# Patient Record
Sex: Female | Born: 1955
Health system: Southern US, Community
[De-identification: ages and names within clinical notes are randomized; demographics above are authoritative.]

## PROBLEM LIST (undated history)

## (undated) DIAGNOSIS — Z136 Encounter for screening for cardiovascular disorders: Secondary | ICD-10-CM

## (undated) DIAGNOSIS — Z8041 Family history of malignant neoplasm of ovary: Secondary | ICD-10-CM

## (undated) DIAGNOSIS — Z8049 Family history of malignant neoplasm of other genital organs: Secondary | ICD-10-CM

## (undated) DIAGNOSIS — N6009 Solitary cyst of unspecified breast: Secondary | ICD-10-CM

## (undated) DIAGNOSIS — Z516 Encounter for desensitization to allergens: Secondary | ICD-10-CM

## (undated) DIAGNOSIS — C439 Malignant melanoma of skin, unspecified: Secondary | ICD-10-CM

## (undated) DIAGNOSIS — K625 Hemorrhage of anus and rectum: Secondary | ICD-10-CM

## (undated) DIAGNOSIS — J45990 Exercise induced bronchospasm: Secondary | ICD-10-CM

## (undated) HISTORY — DX: Encounter for desensitization to allergens: Z51.6

## (undated) HISTORY — DX: Family history of malignant neoplasm of ovary: Z80.41

## (undated) HISTORY — PX: OTHER SURGICAL HISTORY: SHX169

## (undated) HISTORY — DX: Family history of malignant neoplasm of other genital organs: Z80.49

## (undated) HISTORY — DX: Hemorrhage of anus and rectum: K62.5

## (undated) HISTORY — DX: Malignant melanoma of skin, unspecified: C43.9

## (undated) HISTORY — DX: Solitary cyst of unspecified breast: N60.09

## (undated) HISTORY — DX: Exercise induced bronchospasm: J45.990

## (undated) HISTORY — DX: Encounter for screening for cardiovascular disorders: Z13.6

---

## 1987-02-09 HISTORY — PX: OTHER SURGICAL HISTORY: SHX169

## 1997-05-08 ENCOUNTER — Ambulatory Visit (HOSPITAL_COMMUNITY): Admission: RE | Admit: 1997-05-08 | Discharge: 1997-05-08 | Payer: Self-pay | Admitting: Obstetrics & Gynecology

## 1997-05-09 ENCOUNTER — Ambulatory Visit (HOSPITAL_COMMUNITY): Admission: RE | Admit: 1997-05-09 | Discharge: 1997-05-09 | Payer: Self-pay | Admitting: Obstetrics and Gynecology

## 1998-04-28 ENCOUNTER — Other Ambulatory Visit: Admission: RE | Admit: 1998-04-28 | Discharge: 1998-04-28 | Payer: Self-pay | Admitting: Obstetrics & Gynecology

## 1998-05-21 ENCOUNTER — Ambulatory Visit (HOSPITAL_COMMUNITY): Admission: RE | Admit: 1998-05-21 | Discharge: 1998-05-21 | Payer: Self-pay | Admitting: Obstetrics and Gynecology

## 1998-06-02 ENCOUNTER — Other Ambulatory Visit: Admission: RE | Admit: 1998-06-02 | Discharge: 1998-06-02 | Payer: Self-pay | Admitting: Obstetrics & Gynecology

## 1999-04-28 ENCOUNTER — Other Ambulatory Visit: Admission: RE | Admit: 1999-04-28 | Discharge: 1999-04-28 | Payer: Self-pay | Admitting: Obstetrics and Gynecology

## 1999-05-14 ENCOUNTER — Encounter: Admission: RE | Admit: 1999-05-14 | Discharge: 1999-05-14 | Payer: Self-pay | Admitting: Obstetrics and Gynecology

## 1999-05-14 ENCOUNTER — Encounter: Payer: Self-pay | Admitting: Obstetrics and Gynecology

## 2000-05-04 ENCOUNTER — Other Ambulatory Visit: Admission: RE | Admit: 2000-05-04 | Discharge: 2000-05-04 | Payer: Self-pay | Admitting: Obstetrics and Gynecology

## 2000-05-23 ENCOUNTER — Encounter: Payer: Self-pay | Admitting: Obstetrics and Gynecology

## 2000-05-23 ENCOUNTER — Encounter: Admission: RE | Admit: 2000-05-23 | Discharge: 2000-05-23 | Payer: Self-pay | Admitting: Obstetrics and Gynecology

## 2001-10-04 ENCOUNTER — Other Ambulatory Visit: Admission: RE | Admit: 2001-10-04 | Discharge: 2001-10-04 | Payer: Self-pay | Admitting: Obstetrics and Gynecology

## 2001-10-06 ENCOUNTER — Encounter: Payer: Self-pay | Admitting: Obstetrics and Gynecology

## 2001-10-06 ENCOUNTER — Encounter: Admission: RE | Admit: 2001-10-06 | Discharge: 2001-10-06 | Payer: Self-pay | Admitting: Obstetrics and Gynecology

## 2002-10-18 ENCOUNTER — Other Ambulatory Visit: Admission: RE | Admit: 2002-10-18 | Discharge: 2002-10-18 | Payer: Self-pay | Admitting: Obstetrics and Gynecology

## 2002-12-21 ENCOUNTER — Encounter: Admission: RE | Admit: 2002-12-21 | Discharge: 2002-12-21 | Payer: Self-pay | Admitting: Obstetrics and Gynecology

## 2003-12-04 ENCOUNTER — Other Ambulatory Visit: Admission: RE | Admit: 2003-12-04 | Discharge: 2003-12-04 | Payer: Self-pay | Admitting: Obstetrics and Gynecology

## 2004-02-21 ENCOUNTER — Encounter: Admission: RE | Admit: 2004-02-21 | Discharge: 2004-02-21 | Payer: Self-pay | Admitting: Obstetrics and Gynecology

## 2004-03-04 ENCOUNTER — Encounter: Admission: RE | Admit: 2004-03-04 | Discharge: 2004-03-04 | Payer: Self-pay | Admitting: Obstetrics and Gynecology

## 2005-03-17 ENCOUNTER — Encounter: Admission: RE | Admit: 2005-03-17 | Discharge: 2005-03-17 | Payer: Self-pay | Admitting: Internal Medicine

## 2005-04-09 ENCOUNTER — Encounter: Admission: RE | Admit: 2005-04-09 | Discharge: 2005-04-09 | Payer: Self-pay | Admitting: Internal Medicine

## 2005-05-03 ENCOUNTER — Ambulatory Visit: Payer: Self-pay | Admitting: Internal Medicine

## 2005-06-22 ENCOUNTER — Other Ambulatory Visit: Admission: RE | Admit: 2005-06-22 | Discharge: 2005-06-22 | Payer: Self-pay | Admitting: Internal Medicine

## 2005-06-22 ENCOUNTER — Encounter: Payer: Self-pay | Admitting: Internal Medicine

## 2005-06-22 ENCOUNTER — Ambulatory Visit: Payer: Self-pay | Admitting: Internal Medicine

## 2005-10-14 ENCOUNTER — Ambulatory Visit: Payer: Self-pay | Admitting: Gastroenterology

## 2005-10-28 ENCOUNTER — Ambulatory Visit: Payer: Self-pay | Admitting: Gastroenterology

## 2005-10-28 LAB — HM COLONOSCOPY

## 2006-02-24 ENCOUNTER — Ambulatory Visit: Payer: Self-pay | Admitting: Internal Medicine

## 2006-03-11 ENCOUNTER — Encounter: Payer: Self-pay | Admitting: Internal Medicine

## 2006-03-11 ENCOUNTER — Ambulatory Visit: Payer: Self-pay

## 2006-05-25 ENCOUNTER — Encounter: Admission: RE | Admit: 2006-05-25 | Discharge: 2006-05-25 | Payer: Self-pay | Admitting: Internal Medicine

## 2006-09-30 ENCOUNTER — Encounter: Admission: RE | Admit: 2006-09-30 | Discharge: 2006-09-30 | Payer: Self-pay | Admitting: Internal Medicine

## 2006-10-21 ENCOUNTER — Encounter: Payer: Self-pay | Admitting: Internal Medicine

## 2006-10-25 ENCOUNTER — Encounter: Admission: RE | Admit: 2006-10-25 | Discharge: 2006-10-25 | Payer: Self-pay | Admitting: Otolaryngology

## 2007-01-27 ENCOUNTER — Ambulatory Visit: Payer: Self-pay | Admitting: Internal Medicine

## 2007-01-27 LAB — CONVERTED CEMR LAB
Bilirubin Urine: NEGATIVE
Blood in Urine, dipstick: NEGATIVE
Glucose, Urine, Semiquant: NEGATIVE
WBC Urine, dipstick: NEGATIVE
pH: 5.5

## 2007-01-29 LAB — CONVERTED CEMR LAB
AST: 26 units/L (ref 0–37)
Albumin: 3.8 g/dL (ref 3.5–5.2)
Alkaline Phosphatase: 41 units/L (ref 39–117)
BUN: 12 mg/dL (ref 6–23)
Basophils Absolute: 0 10*3/uL (ref 0.0–0.1)
Chloride: 107 meq/L (ref 96–112)
Cholesterol: 223 mg/dL (ref 0–200)
Creatinine, Ser: 1.1 mg/dL (ref 0.4–1.2)
MCHC: 34.2 g/dL (ref 30.0–36.0)
Monocytes Relative: 7.1 % (ref 3.0–11.0)
Potassium: 4.8 meq/L (ref 3.5–5.1)
RBC: 3.75 M/uL — ABNORMAL LOW (ref 3.87–5.11)
RDW: 12.8 % (ref 11.5–14.6)
TSH: 4.07 microintl units/mL (ref 0.35–5.50)
Total Bilirubin: 0.9 mg/dL (ref 0.3–1.2)
Total CHOL/HDL Ratio: 3.5
Triglycerides: 49 mg/dL (ref 0–149)

## 2007-03-03 ENCOUNTER — Other Ambulatory Visit: Admission: RE | Admit: 2007-03-03 | Discharge: 2007-03-03 | Payer: Self-pay | Admitting: Internal Medicine

## 2007-03-03 ENCOUNTER — Ambulatory Visit: Payer: Self-pay | Admitting: Internal Medicine

## 2007-03-03 ENCOUNTER — Encounter: Payer: Self-pay | Admitting: Internal Medicine

## 2007-03-03 DIAGNOSIS — N6009 Solitary cyst of unspecified breast: Secondary | ICD-10-CM | POA: Insufficient documentation

## 2007-03-03 DIAGNOSIS — N951 Menopausal and female climacteric states: Secondary | ICD-10-CM | POA: Insufficient documentation

## 2007-03-03 DIAGNOSIS — E782 Mixed hyperlipidemia: Secondary | ICD-10-CM | POA: Insufficient documentation

## 2007-03-03 LAB — CONVERTED CEMR LAB: Pap Smear: NORMAL

## 2007-05-30 ENCOUNTER — Encounter: Admission: RE | Admit: 2007-05-30 | Discharge: 2007-05-30 | Payer: Self-pay | Admitting: Internal Medicine

## 2008-01-23 ENCOUNTER — Telehealth: Payer: Self-pay | Admitting: *Deleted

## 2008-01-25 ENCOUNTER — Ambulatory Visit: Payer: Self-pay | Admitting: Internal Medicine

## 2008-01-25 DIAGNOSIS — N63 Unspecified lump in unspecified breast: Secondary | ICD-10-CM | POA: Insufficient documentation

## 2008-01-26 ENCOUNTER — Encounter: Admission: RE | Admit: 2008-01-26 | Discharge: 2008-01-26 | Payer: Self-pay | Admitting: Internal Medicine

## 2008-08-01 ENCOUNTER — Encounter: Admission: RE | Admit: 2008-08-01 | Discharge: 2008-08-01 | Payer: Self-pay | Admitting: Internal Medicine

## 2008-10-25 ENCOUNTER — Ambulatory Visit: Payer: Self-pay | Admitting: Internal Medicine

## 2008-10-25 LAB — CONVERTED CEMR LAB
Alkaline Phosphatase: 43 units/L (ref 39–117)
BUN: 12 mg/dL (ref 6–23)
Basophils Absolute: 0.1 10*3/uL (ref 0.0–0.1)
Bilirubin Urine: NEGATIVE
Bilirubin, Direct: 0 mg/dL (ref 0.0–0.3)
CO2: 25 meq/L (ref 19–32)
Calcium: 8.6 mg/dL (ref 8.4–10.5)
Cholesterol: 224 mg/dL — ABNORMAL HIGH (ref 0–200)
Creatinine, Ser: 1.1 mg/dL (ref 0.4–1.2)
Direct LDL: 152.4 mg/dL
Eosinophils Absolute: 0.2 10*3/uL (ref 0.0–0.7)
Glucose, Bld: 82 mg/dL (ref 70–99)
Glucose, Urine, Semiquant: NEGATIVE
Ketones, urine, test strip: NEGATIVE
Lymphocytes Relative: 28.6 % (ref 12.0–46.0)
MCHC: 32.7 g/dL (ref 30.0–36.0)
MCV: 87.6 fL (ref 78.0–100.0)
Monocytes Absolute: 0.4 10*3/uL (ref 0.1–1.0)
Neutrophils Relative %: 55.6 % (ref 43.0–77.0)
Platelets: 250 10*3/uL (ref 150.0–400.0)
RBC: 3.47 M/uL — ABNORMAL LOW (ref 3.87–5.11)
RDW: 12.8 % (ref 11.5–14.6)
Total Bilirubin: 0.7 mg/dL (ref 0.3–1.2)
Total CHOL/HDL Ratio: 4
Triglycerides: 44 mg/dL (ref 0.0–149.0)
Urobilinogen, UA: 0.2
pH: 5.5

## 2008-10-30 ENCOUNTER — Ambulatory Visit: Payer: Self-pay | Admitting: Internal Medicine

## 2008-10-30 DIAGNOSIS — D649 Anemia, unspecified: Secondary | ICD-10-CM | POA: Insufficient documentation

## 2008-10-30 DIAGNOSIS — R002 Palpitations: Secondary | ICD-10-CM | POA: Insufficient documentation

## 2008-12-31 ENCOUNTER — Ambulatory Visit: Payer: Self-pay | Admitting: Internal Medicine

## 2008-12-31 ENCOUNTER — Other Ambulatory Visit: Admission: RE | Admit: 2008-12-31 | Discharge: 2008-12-31 | Payer: Self-pay | Admitting: Internal Medicine

## 2008-12-31 LAB — CONVERTED CEMR LAB: Hemoglobin: 10.8 g/dL

## 2009-10-22 ENCOUNTER — Telehealth: Payer: Self-pay | Admitting: *Deleted

## 2009-12-22 ENCOUNTER — Encounter: Admission: RE | Admit: 2009-12-22 | Discharge: 2009-12-22 | Payer: Self-pay | Admitting: Internal Medicine

## 2010-01-19 ENCOUNTER — Ambulatory Visit: Payer: Self-pay | Admitting: Internal Medicine

## 2010-01-23 LAB — CONVERTED CEMR LAB
AST: 28 units/L (ref 0–37)
Albumin: 4 g/dL (ref 3.5–5.2)
BUN: 22 mg/dL (ref 6–23)
Basophils Absolute: 0.1 10*3/uL (ref 0.0–0.1)
CO2: 27 meq/L (ref 19–32)
Calcium: 8.9 mg/dL (ref 8.4–10.5)
Eosinophils Absolute: 0.3 10*3/uL (ref 0.0–0.7)
GFR calc non Af Amer: 69 mL/min (ref >60.00)
Glucose, Bld: 63 mg/dL — ABNORMAL LOW (ref 70–99)
HCT: 40.3 % (ref 36.0–46.0)
HDL: 72.4 mg/dL (ref >39.00)
Hemoglobin: 13.6 g/dL (ref 12.0–15.0)
Leukocytes, UA: NEGATIVE
Lymphs Abs: 1.4 10*3/uL (ref 0.7–4.0)
MCHC: 33.8 g/dL (ref 30.0–36.0)
Monocytes Relative: 6.4 % (ref 3.0–12.0)
Neutro Abs: 2.3 10*3/uL (ref 1.4–7.7)
Nitrite: NEGATIVE
Platelets: 228 10*3/uL (ref 150.0–400.0)
Potassium: 4.5 meq/L (ref 3.5–5.1)
RDW: 13.5 % (ref 11.5–14.6)
Sodium: 139 meq/L (ref 135–145)
Specific Gravity, Urine: 1.025 (ref 1.000–1.030)
TSH: 3.17 microintl units/mL (ref 0.35–5.50)
Total Bilirubin: 0.8 mg/dL (ref 0.3–1.2)
Triglycerides: 59 mg/dL (ref 0.0–149.0)
Urobilinogen, UA: 0.2 (ref 0.0–1.0)
pH: 5.5 (ref 5.0–8.0)

## 2010-02-06 ENCOUNTER — Encounter: Payer: Self-pay | Admitting: Internal Medicine

## 2010-02-06 ENCOUNTER — Ambulatory Visit: Payer: Self-pay | Admitting: Internal Medicine

## 2010-02-13 ENCOUNTER — Encounter: Payer: Self-pay | Admitting: Internal Medicine

## 2010-03-12 NOTE — Progress Notes (Signed)
Summary: scheduled Fri Dr. Abner Greenspan  Phone Note Call from Patient Call back at [phone]   Caller: vm Summary of Call:  Head cold.  Fever x 2 days, 99-100.  Sinus pressure, since Mon.  Watery drip yesterday.  Has moved into chest a little.  Any cough is nonproductive.  Decongestant & Advil at night.  Should I come in or is this something going around?  Advised saline nasal spray and/or Casilda Carls that she mentioned.  Will give it another day & call back prn appointment.  Anything else Dr. Demetrius Charity might suggest to [facility].  NKDA.  Rudy Jew, RN  October 22, 2009 2:55 PM   Initial call taken by: Rudy Jew, RN,  October 22, 2009 2:40 PM  Follow-up for Phone Call        i agree    if fever over 100.3 lasts more than 4 days needs to be seen . other wise  most sinus infections   imroove resolve in aboiut 10 days .  call if not  or persistent fever or wheezing  Follow-up by: Madelin Headings MD,  October 22, 2009 5:21 PM  Additional Follow-up for Phone Call Additional follow up Details #1::        pATIENT REPORTS THAT HSE HAS GONE TO BED, that her T is 101.  She scheduled to see Dr. Abner Greenspan Friday, she wanted to give it one more day.   Additional Follow-up by: Rudy Jew, RN,  October 22, 2009 6:07 PM

## 2010-03-12 NOTE — Assessment & Plan Note (Signed)
Summary: CPX // RS----OK PER SHANNON // RS   Vital Signs:  Patient profile:   55 year old female Menstrual status:  regular LMP:     01/23/2010 Height:      66.75 inches Weight:      136 pounds BMI:     21.54 Pulse rate:   72 / minute BP sitting:   100 / 60  (right arm) Cuff size:   regular  Vitals Entered By: Romualdo Bolk, CMA (AAMA) (February 06, 2010 10:42 AM) CC: CPX- Pt to discuss waiting until next year to do pap LMP (date): 01/23/2010 LMP - Character: normal Menarche (age onset years): 14   Menses interval (days): 28ds-32mos Menstrual flow (days): 5 Enter LMP: 01/23/2010 Last PAP Result NEGATIVE FOR INTRAEPITHELIAL LESIONS OR MALIGNANCY.   History of Present Illness: Lynn Griffin comes in today  for preventive visit .since last visit doing well  Anemia feels better after iron supplementation   LAst menses recently and one before in July Gets depressed feeling moody before period and if forgets MVI . Does better with it.  Palpitations  still l happens but no racing cp sob and not when exercising. comes in spells at times.  No syncope cp or assoicated symptoms .    Preventive Care Screening  Prior Values:    Pap Smear:  NEGATIVE FOR INTRAEPITHELIAL LESIONS OR MALIGNANCY. (12/31/2008)    Mammogram:  ASSESSMENT: Negative - BI-RADS 1^MM DIGITAL SCREENING (12/22/2009)    Colonoscopy:  Normal (10/28/2005)    Last Tetanus Booster:  Tdap (06/22/2005)   Preventive Screening-Counseling & Management  Alcohol-Tobacco     Alcohol drinks/day: <1     Alcohol type: wine or beer     Smoking Status: quit     Year Quit: 25 years ago  Caffeine-Diet-Exercise     Caffeine use/day: 2     Does Patient Exercise: yes     Type of exercise: wts, treadmill, run, yoga     Times/week: 4  Hep-HIV-STD-Contraception     Dental Visit-last 6 months yes  Safety-Violence-Falls     Seat Belt Use: yes     Firearms in the Home: no firearms in the home     Smoke Detectors:  yes  Current Medications (verified): 1)  Proair Hfa 108 (90 Base) Mcg/act Aers (Albuterol Sulfate) .... Use As Directed  Allergies (verified): No Known Drug Allergies  Past History:  Past medical, surgical, family and social histories (including risk factors) reviewed, and no changes noted (except as noted below).  Past Medical History: childbirth x2 ? EIA Breast cysts  Myoview stress neg 2008  EKG 2007  Hx o f all;ergy shots as a child  Past Surgical History: Reviewed history from 01/25/2008 and no changes required. Denies surgical history left knee 88 reconstruction.     Past History:  Care Management: Gastroenterology: [facility]  Family History: Reviewed history from 10/30/2008 and no changes required. Family History of CAD Female 1st degree relative <50 father 5 Neg fam hx breast cancer disease.   MOm Had parkinsons died age 25   had anemia. SIb  3 sis and 1 brother   good health    Social History: Reviewed history from 10/30/2008 and no changes required. Married Former Smoker Alcohol use-no Drug use-no Regular exercise-yes hhof 4   2 cats   Review of Systems  The patient denies anorexia, fever, weight loss, weight gain, hoarseness, chest pain, syncope, dyspnea on exertion, prolonged cough, hemoptysis, abdominal pain, melena, hematochezia, severe indigestion/heartburn, hematuria,  muscle weakness, transient blindness, difficulty walking, unusual weight change, abnormal bleeding, enlarged lymph nodes, and angioedema.         depressed ocass premenstrually and iron seems to help this  sometimes harder to hear .  sometimes left foot colder than left but no problems exercising adn no claudication  Physical Exam  General:  Well-developed,well-nourished,in no acute distress; alert,appropriate and cooperative throughout examination Head:  normocephalic and atraumatic.   Eyes:  vision grossly intact, pupils equal, and pupils round.   Ears:  R ear normal, L ear  normal, and no external deformities.   Nose:  no external deformity, no external erythema, and no nasal discharge.   Mouth:  good dentition and pharynx pink and moist.   Neck:  No deformities, masses, or tenderness noted. Breasts:  No mass, nodules, thickening, tenderness, bulging, retraction, inflamation, nipple discharge or skin changes noted.   Lungs:  Normal respiratory effort, chest expands symmetrically. Lungs are clear to auscultation, no crackles or wheezes. Heart:  Normal rate and regular rhythm. S1 and S2 normal without gallop, murmur, click, rub or other extra sounds. ocass irreg beat Abdomen:  Bowel sounds positive,abdomen soft and non-tender without masses, organomegaly or hernias noted. Pulses:  pulses intact without delay  hard to find left dp but nl pt  no bruits  Extremities:  no clubbing cyanosis or edema  Neurologic:  alert & oriented X3, strength normal in all extremities, gait normal, and DTRs symmetrical and normal.   Skin:  turgor normal, color normal, no ecchymoses, no petechiae, and no purpura.   Cervical Nodes:  No lymphadenopathy noted Axillary Nodes:  No palpable lymphadenopathy Psych:  Oriented X3, normally interactive, good eye contact, not depressed appearing, and slightly anxious.   EKG  unifocal  pvcs  sinus rhythym no acute changes .  . small r in v3   . Labs reviewed .   Impression & Recommendations:  Problem # 1:  HEALTH MAINTENANCE EXAM (ICD-V70.0) Discussed nutrition,exercise,diet,healthy weight, vitamin D and calcium.      Problem # 2:  PALPITATIONS, OCCASIONAL (ICD-785.1)  ekg today nad  but  unifocal pvcs documentated. decrease r wave v 3 but no qs  had neg stress myoview in the past .  no exercise related signs and doing well  .  however if persistent or  progressive  consider event monitor echo and  or checking with  Cards  Orders: EKG w/ Interpretation (93000)  Problem # 3:  PERIMENOPAUSAL STATUS (ICD-627.2) Assessment: Comment Only counseled   seems mostly normal but could affect sleep and mood a bit.  Problem # 4:  ANEMIA (ICD-285.9) Assessment: Improved  but still slightly iron deficient  Problem # 5:  HYPERLIPIDEMIA (ICD-272.2) good ratio  high hdl   disc use of particle size  assessment that she asks about .  may not  be helpful unless it change  interventions  and cost an issues.   can ask insurance if she wishes to check this  however she  has a pretty healthy lifestyle relating to the cv area .  does have a family hx of  premature HD in father age 98  Labs Reviewed: SGOT: 28 (01/23/2010)   SGPT: 23 (01/23/2010)   HDL:72.40 (01/23/2010), 63.60 (10/25/2008)  LDL:DEL (01/27/2007)  Chol:254 (01/23/2010), 224 (10/25/2008)  Trig:59.0 (01/23/2010), 44.0 (10/25/2008)  Complete Medication List: 1)  Proair Hfa 108 (90 Base) Mcg/act Aers (Albuterol sulfate) .... Use as directed  Other Orders: Flu Vaccine 39yrs + (04540) Admin 1st Vaccine (98119)  Patient Instructions: 1)  check tsh and free t4 and  thyroid antibodies also IBC  in 4-6 months.  No OV needed if ok and doing well.  2)  Can consider seeing cardiology and do event moinitoring for your palpitations  3)  Call if you want to do a  particle size for your lipids.  Marland KitchenMarland Kitchen 4)  Otherwise check in a year .   Orders Added: 1)  Flu Vaccine 13yrs + [90658] 2)  Admin 1st Vaccine [90471] 3)  EKG w/ Interpretation [93000] 4)  Est. Patient 40-64 years [99396] 5)  Est. Patient Level II [16109]   Immunizations Administered:  Influenza Vaccine # 1:    Vaccine Type: Fluvax 3+    Site: right deltoid    Mfr: Sanofi Pasteur    Dose: 0.5 ml    Route: IM    Given by: Romualdo Bolk, CMA (AAMA)    Exp. Date: 08/08/2010    Lot #: [id number]  Flu Vaccine Consent Questions:    Do you have a history of severe allergic reactions to this vaccine? no    Any prior history of allergic reactions to egg and/or gelatin? no    Do you have a sensitivity to the preservative Thimersol? no    Do  you have a past history of Guillan-Barre Syndrome? no    Do you currently have an acute febrile illness? no    Have you ever had a severe reaction to latex? no    Vaccine information given and explained to patient? yes    Are you currently pregnant? no   Immunizations Administered:  Influenza Vaccine # 1:    Vaccine Type: Fluvax 3+    Site: right deltoid    Mfr: Sanofi Pasteur    Dose: 0.5 ml    Route: IM    Given by: Romualdo Bolk, CMA (AAMA)    Exp. Date: 08/08/2010    Lot #: [id number]

## 2010-03-27 ENCOUNTER — Other Ambulatory Visit: Payer: Self-pay | Admitting: Internal Medicine

## 2010-07-09 ENCOUNTER — Encounter: Payer: Self-pay | Admitting: Internal Medicine

## 2010-07-09 ENCOUNTER — Ambulatory Visit (INDEPENDENT_AMBULATORY_CARE_PROVIDER_SITE_OTHER): Payer: BC Managed Care – PPO | Admitting: Internal Medicine

## 2010-07-09 VITALS — BP 100/70 | HR 85 | Temp 98.3°F | Wt 133.0 lb

## 2010-07-09 DIAGNOSIS — K625 Hemorrhage of anus and rectum: Secondary | ICD-10-CM

## 2010-07-09 DIAGNOSIS — E038 Other specified hypothyroidism: Secondary | ICD-10-CM

## 2010-07-09 HISTORY — DX: Hemorrhage of anus and rectum: K62.5

## 2010-07-09 LAB — CBC WITH DIFFERENTIAL/PLATELET
Eosinophils Absolute: 0.2 10*3/uL (ref 0.0–0.7)
Eosinophils Relative: 3.3 % (ref 0.0–5.0)
HCT: 38 % (ref 36.0–46.0)
Lymphs Abs: 1.8 10*3/uL (ref 0.7–4.0)
MCHC: 34.6 g/dL (ref 30.0–36.0)
MCV: 95.3 fl (ref 78.0–100.0)
Monocytes Absolute: 0.3 10*3/uL (ref 0.1–1.0)
Neutrophils Relative %: 56.3 % (ref 43.0–77.0)
Platelets: 235 10*3/uL (ref 150.0–400.0)
RDW: 13.1 % (ref 11.5–14.6)
WBC: 5.4 10*3/uL (ref 4.5–10.5)

## 2010-07-09 LAB — TSH: TSH: 2.26 u[IU]/mL (ref 0.35–5.50)

## 2010-07-09 LAB — T4, FREE: Free T4: 0.74 ng/dL (ref 0.60–1.60)

## 2010-07-09 NOTE — Progress Notes (Signed)
  Subjective:    Patient ID: Lynn Griffin, female    DOB: Jul 26, 1955, 55 y.o.   MRN: [id number]  HPI Pt presents to clinic for evaluation of rectal bleeding. Notes was in normal state of health until yesterday observed BRBPR with BM. Had no associated rectal or abdominal pain and denies prior change in bowel habits. Has no family hx of colon ca or IBD. Record review indicates last colonoscopy performed 2007 and normal.  No alleviating or exacerbating factors. No other complaints.  Reviewed pmh, medications and allergies    Review of Systems  Constitutional: Negative for appetite change and unexpected weight change.  Gastrointestinal: Positive for blood in stool and anal bleeding. Negative for nausea, vomiting, abdominal pain, diarrhea, constipation and rectal pain.  Neurological: Negative for dizziness.       Objective:   Physical Exam  Nursing note and vitals reviewed. Constitutional: She appears well-developed and well-nourished. No distress.  HENT:  Head: Normocephalic and atraumatic.  Abdominal: Soft. Bowel sounds are normal. She exhibits no distension and no mass. There is no rebound and no guarding.       Minimal discomfort to palpation LLQ. +BS  Genitourinary:       With female nurse escort DRE exam performed. Good spinchter tone, no gross blood, tenderness or mass noted. Heme neg.  Neurological: She is alert.  Skin: Skin is warm and dry. No rash noted. She is not diaphoretic. No erythema.  Psychiatric: She has a normal mood and affect.          Assessment & Plan:

## 2010-07-09 NOTE — Assessment & Plan Note (Signed)
No current gross active bleeding. Obtain CBC. GI consult for consideration of possible colonoscopy. Instructed to present to the ED with gross active bleeding and/or severe abdominal pain. States understanding and agreement.

## 2010-07-10 ENCOUNTER — Telehealth: Payer: Self-pay

## 2010-07-10 NOTE — Telephone Encounter (Signed)
Pt notified. Pt aware other labs still pending

## 2010-07-10 NOTE — Telephone Encounter (Signed)
Message copied by Beverely Low on Fri Jul 10, 2010  4:07 PM ------      Message from: Staci Righter      Created: Thu Jul 09, 2010  9:27 PM       Cbc nl

## 2010-07-13 ENCOUNTER — Other Ambulatory Visit: Payer: Self-pay | Admitting: Internal Medicine

## 2010-07-13 ENCOUNTER — Other Ambulatory Visit: Payer: Self-pay

## 2010-07-13 ENCOUNTER — Encounter: Payer: Self-pay | Admitting: *Deleted

## 2010-07-13 DIAGNOSIS — E038 Other specified hypothyroidism: Secondary | ICD-10-CM

## 2011-05-21 ENCOUNTER — Other Ambulatory Visit (INDEPENDENT_AMBULATORY_CARE_PROVIDER_SITE_OTHER): Payer: BC Managed Care – PPO

## 2011-05-21 DIAGNOSIS — Z0000 Encounter for general adult medical examination without abnormal findings: Secondary | ICD-10-CM

## 2011-05-21 LAB — BASIC METABOLIC PANEL
CO2: 26 mEq/L (ref 19–32)
Calcium: 9.2 mg/dL (ref 8.4–10.5)
Creatinine, Ser: 0.9 mg/dL (ref 0.4–1.2)
Glucose, Bld: 76 mg/dL (ref 70–99)

## 2011-05-21 LAB — LIPID PANEL
Cholesterol: 247 mg/dL — ABNORMAL HIGH (ref 0–200)
Total CHOL/HDL Ratio: 4
Triglycerides: 55 mg/dL (ref 0.0–149.0)

## 2011-05-21 LAB — HEPATIC FUNCTION PANEL
Albumin: 4.2 g/dL (ref 3.5–5.2)
Alkaline Phosphatase: 60 U/L (ref 39–117)
Bilirubin, Direct: 0 mg/dL (ref 0.0–0.3)

## 2011-05-21 LAB — CBC WITH DIFFERENTIAL/PLATELET
Basophils Absolute: 0.1 10*3/uL (ref 0.0–0.1)
Eosinophils Absolute: 0.2 10*3/uL (ref 0.0–0.7)
Lymphocytes Relative: 36.5 % (ref 12.0–46.0)
MCHC: 33.6 g/dL (ref 30.0–36.0)
Neutrophils Relative %: 51 % (ref 43.0–77.0)
Platelets: 222 10*3/uL (ref 150.0–400.0)
RDW: 12.9 % (ref 11.5–14.6)

## 2011-05-21 LAB — POCT URINALYSIS DIPSTICK
Glucose, UA: NEGATIVE
Ketones, UA: NEGATIVE
Spec Grav, UA: 1.015
Urobilinogen, UA: 0.2

## 2011-05-31 ENCOUNTER — Other Ambulatory Visit (HOSPITAL_COMMUNITY)
Admission: RE | Admit: 2011-05-31 | Discharge: 2011-05-31 | Disposition: A | Discharge status: Home / Self Care | Payer: BC Managed Care – PPO | Source: Ambulatory Visit | Attending: Internal Medicine | Admitting: Internal Medicine

## 2011-05-31 ENCOUNTER — Ambulatory Visit (INDEPENDENT_AMBULATORY_CARE_PROVIDER_SITE_OTHER): Payer: BC Managed Care – PPO | Admitting: Internal Medicine

## 2011-05-31 ENCOUNTER — Encounter: Payer: Self-pay | Admitting: Internal Medicine

## 2011-05-31 VITALS — BP 102/74 | HR 73 | Temp 98.6°F | Ht 67.0 in | Wt 136.0 lb

## 2011-05-31 DIAGNOSIS — J45990 Exercise induced bronchospasm: Secondary | ICD-10-CM

## 2011-05-31 DIAGNOSIS — Z0000 Encounter for general adult medical examination without abnormal findings: Secondary | ICD-10-CM

## 2011-05-31 DIAGNOSIS — E782 Mixed hyperlipidemia: Secondary | ICD-10-CM

## 2011-05-31 DIAGNOSIS — Z01419 Encounter for gynecological examination (general) (routine) without abnormal findings: Secondary | ICD-10-CM | POA: Insufficient documentation

## 2011-05-31 NOTE — Progress Notes (Signed)
Subjective:    Patient ID: Lynn Griffin, female    DOB: December 10, 1955, 56 y.o.   MRN: [id number]  HPI  Patient comes in today for Preventive Health Care visit  Last visit over a year ago  2011. No major changes ; ,injury surgery or hospitalizations. Asthma stable  No cp sob  Hx of elevated lipids. Last pap was  11 2011 nl no hx ofa bnormal  pvcs are better    Review of Systems ROS:  GEN/ HEENT: No fever, significant weight changes sweats headaches vision problems hearing changes, CV/ PULM; No chest pain shortness of breath cough, syncope,edema  change in exercise tolerance. GI /GU: No adominal pain, vomiting, change in bowel habits.  No significant GU symptoms. SKIN/HEME: ,no acute skin rashes suspicious lesions or bleeding. No lymphadenopathy, nodules, masses.  NEURO/ PSYCH:  No neurologic signs such as weakness numbness. No depression anxiety. IMM/ Allergy: No unusual infections.  Allergy .   REST of 12 system review negative except as per HPI  Past history family history social history reviewed in the electronic medical record.      Objective:   Physical Exam BP 102/74  Pulse 73  Temp(Src) 98.6 F (37 C) (Oral)  Ht 5\' 7"  (1.702 m)  Wt 136 lb (61.689 kg)  BMI 21.30 kg/m2  SpO2 99% Physical Exam: Vital signs reviewed QIO:NGEX is a well-developed well-nourished alert cooperative  white female who appears her stated age in no acute distress.  HEENT: normocephalic atraumatic , Eyes: PERRL EOM's full, conjunctiva clear, Nares: paten,t no deformity discharge or tenderness., Ears: no deformity EAC's clear TMs with normal landmarks. Mouth: clear OP, no lesions, edema.  Moist mucous membranes. Dentition in adequate repair. NECK: supple without masses, thyromegaly or bruits. CHEST/PULM:  Clear to auscultation and percussion breath sounds equal no wheeze , rales or rhonchi. No chest wall deformities or tenderness. CV: PMI is nondisplaced, S1 S2 no gallops, murmurs, rubs. Peripheral  pulses are full without delay.No JVD . Breast: normal by inspection . No dimpling, discharge, masses, tenderness or discharge .  ABDOMEN: Bowel sounds normal nontender  No guard or rebound, no hepato splenomegal no CVA tenderness.  No hernia. Extremtities:  No clubbing cyanosis or edema, no acute joint swelling or redness no focal atrophy NEURO:  Oriented x3, cranial nerves 3-12 appear to be intact, no obvious focal weakness,gait within normal limits no abnormal reflexes or asymmetrical SKIN: No acute rashes normal turgor, color, no bruising or petechiae. PSYCH: Oriented, good eye contact, no obvious depression anxiety, cognition and judgment appear normal. LN: no cervical axillary inguinal adenopathy Pelvic: NL ext GU, labia clear without lesions or rash . Vagina no lesions .Cervix: clear  UTERUS: Neg CMT Adnexa:  clear no masses . PAP done  Lab Results  Component Value Date   WBC 4.0* 05/21/2011   HGB 13.3 05/21/2011   HCT 39.7 05/21/2011   PLT 222.0 05/21/2011   GLUCOSE 76 05/21/2011   CHOL 247* 05/21/2011   TRIG 55.0 05/21/2011   HDL 66.60 05/21/2011   LDLDIRECT 175.3 05/21/2011   ALT 20 05/21/2011   AST 28 05/21/2011   NA 138 05/21/2011   K 4.2 05/21/2011   CL 104 05/21/2011   CREATININE 0.9 05/21/2011   BUN 21 05/21/2011   CO2 26 05/21/2011   TSH 2.99 05/21/2011         Assessment & Plan:  Preventive Health Care Counseled regarding healthy nutrition, exercise, sleep, injury prevention, calcium vit d and healthy weight . Gyne  exam  LIPIDS  Counseled.  Get mammogram  Stool cards  Disc screening Fu in a year or prn

## 2011-05-31 NOTE — Patient Instructions (Signed)
Get a mammogram Continue lifestyle intervention healthy eating and exercise . cholesterol could be better  Will let ou know of pap results  Recheck cpx in a  Year or as needed   Cholesterol Control Diet Cholesterol levels in your body are determined significantly by your diet. Cholesterol levels may also be related to heart disease. The following material helps to explain this relationship and discusses what you can do to help keep your heart healthy. Not all cholesterol is bad. Low-density lipoprotein (LDL) cholesterol is the "bad" cholesterol. It may cause fatty deposits to build up inside your arteries. High-density lipoprotein (HDL) cholesterol is "good." It helps to remove the "bad" LDL cholesterol from your blood. Cholesterol is a very important risk factor for heart disease. Other risk factors are high blood pressure, smoking, stress, heredity, and weight. The heart muscle gets its supply of blood through the coronary arteries. If your LDL cholesterol is high and your HDL cholesterol is low, you are at risk for having fatty deposits build up in your coronary arteries. This leaves less room through which blood can flow. Without sufficient blood and oxygen, the heart muscle cannot function properly and you may feel chest pains (angina pectoris). When a coronary artery closes up entirely, a part of the heart muscle may die, causing a heart attack (myocardial infarction). CHECKING CHOLESTEROL When your caregiver sends your blood to a lab to be analyzed for cholesterol, a complete lipid (fat) profile may be done. With this test, the total amount of cholesterol and levels of LDL and HDL are determined. Triglycerides are a type of fat that circulates in the blood and can also be used to determine heart disease risk. The list below describes what the numbers should be: Test: Total Cholesterol.  Less than 200 mg/dl.  Test: LDL "bad cholesterol."  Less than 100 mg/dl.   Less than 70 mg/dl if you are  at very high risk of a heart attack or sudden cardiac death.  Test: HDL "good cholesterol."  Greater than 50 mg/dl for women.   Greater than 40 mg/dl for men.  Test: Triglycerides.  Less than 150 mg/dl.  CONTROLLING CHOLESTEROL WITH DIET Although exercise and lifestyle factors are important, your diet is key. That is because certain foods are known to raise cholesterol and others to lower it. The goal is to balance foods for their effect on cholesterol and more importantly, to replace saturated and trans fat with other types of fat, such as monounsaturated fat, polyunsaturated fat, and omega-3 fatty acids. On average, a person should consume no more than 15 to 17 g of saturated fat daily. Saturated and trans fats are considered "bad" fats, and they will raise LDL cholesterol. Saturated fats are primarily found in animal products such as meats, butter, and cream. However, that does not mean you need to sacrifice all your favorite foods. Today, there are good tasting, low-fat, low-cholesterol substitutes for most of the things you like to eat. Choose low-fat or nonfat alternatives. Choose round or loin cuts of red meat, since these types of cuts are lowest in fat and cholesterol. Chicken (without the skin), fish, veal, and ground [place] breast are excellent choices. Eliminate fatty meats, such as hot dogs and salami. Even shellfish have little or no saturated fat. Have a 3 oz (85 g) portion when you eat lean meat, poultry, or fish. Trans fats are also called "partially hydrogenated oils." They are oils that have been scientifically manipulated so that they are solid at room temperature  resulting in a longer shelf life and improved taste and texture of foods in which they are added. Trans fats are found in stick margarine, some tub margarines, cookies, crackers, and baked goods.  When baking and cooking, oils are an excellent substitute for butter. The monounsaturated oils are especially beneficial since  it is believed they lower LDL and raise HDL. The oils you should avoid entirely are saturated tropical oils, such as coconut and palm.  Remember to eat liberally from food groups that are naturally free of saturated and trans fat, including fish, fruit, vegetables, beans, grains (barley, rice, couscous, bulgur wheat), and pasta (without cream sauces).  IDENTIFYING FOODS THAT LOWER CHOLESTEROL  Soluble fiber may lower your cholesterol. This type of fiber is found in fruits such as apples, vegetables such as broccoli, potatoes, and carrots, legumes such as beans, peas, and lentils, and grains such as barley. Foods fortified with plant sterols (phytosterol) may also lower cholesterol. You should eat at least 2 g per day of these foods for a cholesterol lowering effect.  Read package labels to identify low-saturated fats, trans fats free, and low-fat foods at the supermarket. Select cheeses that have only 2 to 3 g saturated fat per ounce. Use a heart-healthy tub margarine that is free of trans fats or partially hydrogenated oil. When buying baked goods (cookies, crackers), avoid partially hydrogenated oils. Breads and muffins should be made from whole grains (whole-wheat or whole oat flour, instead of "flour" or "enriched flour"). Buy non-creamy canned soups with reduced salt and no added fats.  FOOD PREPARATION TECHNIQUES  Never deep-fry. If you must fry, either stir-fry, which uses very little fat, or use non-stick cooking sprays. When possible, broil, bake, or roast meats, and steam vegetables. Instead of dressing vegetables with butter or margarine, use lemon and herbs, applesauce and cinnamon (for squash and sweet potatoes), nonfat yogurt, salsa, and low-fat dressings for salads.  LOW-SATURATED FAT / LOW-FAT FOOD SUBSTITUTES Meats / Saturated Fat (g)  Avoid: Steak, marbled (3 oz/85 g) / 11 g   Choose: Steak, lean (3 oz/85 g) / 4 g   Avoid: Hamburger (3 oz/85 g) / 7 g   Choose: Hamburger, lean (3  oz/85 g) / 5 g   Avoid: Ham (3 oz/85 g) / 6 g   Choose: Ham, lean cut (3 oz/85 g) / 2.4 g   Avoid: Chicken, with skin, dark meat (3 oz/85 g) / 4 g   Choose: Chicken, skin removed, dark meat (3 oz/85 g) / 2 g   Avoid: Chicken, with skin, light meat (3 oz/85 g) / 2.5 g   Choose: Chicken, skin removed, light meat (3 oz/85 g) / 1 g  Dairy / Saturated Fat (g)  Avoid: Whole milk (1 cup) / 5 g   Choose: Low-fat milk, 2% (1 cup) / 3 g   Choose: Low-fat milk, 1% (1 cup) / 1.5 g   Choose: Skim milk (1 cup) / 0.3 g   Avoid: Hard cheese (1 oz/28 g) / 6 g   Choose: Skim milk cheese (1 oz/28 g) / 2 to 3 g   Avoid: Cottage cheese, 4% fat (1 cup) / 6.5 g   Choose: Low-fat cottage cheese, 1% fat (1 cup) / 1.5 g   Avoid: Ice cream (1 cup) / 9 g   Choose: Sherbet (1 cup) / 2.5 g   Choose: Nonfat frozen yogurt (1 cup) / 0.3 g   Choose: Frozen fruit bar / trace   Avoid: Whipped cream (  1 tbs) / 3.5 g   Choose: Nondairy whipped topping (1 tbs) / 1 g  Condiments / Saturated Fat (g)  Avoid: Mayonnaise (1 tbs) / 2 g   Choose: Low-fat mayonnaise (1 tbs) / 1 g   Avoid: Butter (1 tbs) / 7 g   Choose: Extra light margarine (1 tbs) / 1 g   Avoid: Coconut oil (1 tbs) / 11.8 g   Choose: Olive oil (1 tbs) / 1.8 g   Choose: Corn oil (1 tbs) / 1.7 g   Choose: Safflower oil (1 tbs) / 1.2 g   Choose: Sunflower oil (1 tbs) / 1.4 g   Choose: Soybean oil (1 tbs) / 2.4 g   Choose: Canola oil (1 tbs) / 1 g  Document Released: 01/25/2005 Document Revised: 01/14/2011 Document Reviewed: 07/16/2010 [facility] Patient Information 2012 [place], [place].

## 2011-06-02 NOTE — Progress Notes (Signed)
Quick Note:  Tell patient PAP is normal. ______ 

## 2011-06-03 ENCOUNTER — Encounter: Payer: Self-pay | Admitting: Internal Medicine

## 2011-06-03 DIAGNOSIS — J45990 Exercise induced bronchospasm: Secondary | ICD-10-CM | POA: Insufficient documentation

## 2011-06-03 DIAGNOSIS — Z0000 Encounter for general adult medical examination without abnormal findings: Secondary | ICD-10-CM | POA: Insufficient documentation

## 2011-06-03 DIAGNOSIS — Z136 Encounter for screening for cardiovascular disorders: Secondary | ICD-10-CM | POA: Insufficient documentation

## 2011-06-07 NOTE — Progress Notes (Signed)
Quick Note:  Letter mailed to pt ______ 

## 2011-12-21 ENCOUNTER — Other Ambulatory Visit: Payer: Self-pay | Admitting: Internal Medicine

## 2011-12-21 DIAGNOSIS — Z1231 Encounter for screening mammogram for malignant neoplasm of breast: Secondary | ICD-10-CM

## 2012-01-28 ENCOUNTER — Ambulatory Visit: Payer: BC Managed Care – PPO

## 2012-02-23 ENCOUNTER — Ambulatory Visit
Admission: RE | Admit: 2012-02-23 | Discharge: 2012-02-23 | Disposition: A | Discharge status: Home / Self Care | Payer: Self-pay | Source: Ambulatory Visit | Attending: Internal Medicine | Admitting: Internal Medicine

## 2012-02-23 DIAGNOSIS — Z1231 Encounter for screening mammogram for malignant neoplasm of breast: Secondary | ICD-10-CM

## 2012-03-01 ENCOUNTER — Encounter: Payer: Self-pay | Admitting: Internal Medicine

## 2012-03-01 ENCOUNTER — Ambulatory Visit (INDEPENDENT_AMBULATORY_CARE_PROVIDER_SITE_OTHER): Payer: BC Managed Care – PPO | Admitting: Internal Medicine

## 2012-03-01 VITALS — BP 100/70 | HR 81 | Temp 97.7°F | Wt 135.0 lb

## 2012-03-01 DIAGNOSIS — IMO0001 Reserved for inherently not codable concepts without codable children: Secondary | ICD-10-CM

## 2012-03-01 DIAGNOSIS — R229 Localized swelling, mass and lump, unspecified: Secondary | ICD-10-CM

## 2012-03-01 NOTE — Patient Instructions (Signed)
Thi small bump may go away with time nd may be a plugged glandular area.   Warm compresses  2-3 x per day minimum   Foe the next week or so .   If  persistent or progressive after 3 -4 weeks  Lets recheck and make a decision about further evaluation.

## 2012-03-01 NOTE — Progress Notes (Signed)
Chief Complaint  Patient presents with  . Mass    On the vaginal area towards her anus.    HPI: Patient comes in today for SDA for  new problem evaluation. Noted over the weekend 3-4 days ago a sore area in the perineal area  and noted a bump. No rx except avocado oil  Is not sore any more but persists  Here to check this out  No systemic sx abd pain vag issues or  Hx of same.   ROS: See pertinent positives and negatives per HPI.  Past Medical History  Diagnosis Date  . Exercise-induced asthma   . Breast cyst   . Treadmill stress test negative for angina pectoris     neg myoview 2008  . Desensitization to allergy shot     hx of as a child   . Rectal bleeding 07/09/2010    Probably was beet juice      Family History  Problem Relation Age of Onset  . Anemia Mother   . Parkinsonism Mother 30  . Coronary artery disease Father 80  . Healthy Sister   . Healthy Brother   . Healthy Sister   . Healthy Sister     History   Social History  . Marital Status: Married    Spouse Name: N/A    Number of Children: N/A  . Years of Education: N/A   Social History Main Topics  . Smoking status: Former Games developer  . Smokeless tobacco: Never Used  . Alcohol Use: Yes     Comment: occasionally   . Drug Use: None  . Sexually Active: None   Other Topics Concern  . None   Social History Narrative   MarriedFormer smokerRegular Exercise- yeshh of 32 catsChild biirth x 2     Outpatient Encounter Prescriptions as of 03/01/2012  Medication Sig Dispense Refill  . PROAIR HFA 108 (90 BASE) MCG/ACT inhaler use as directed  8.5 g  1    EXAM:  BP 100/70  Pulse 81  Temp 97.7 F (36.5 C) (Oral)  Wt 135 lb (61.236 kg)  SpO2 98%  There is no height on file to calculate BMI.  GENERAL: vitals reviewed and listed above, alert, oriented, appears well hydrated and in no acute distress  Ext gu no acute change there is a 3 mm red pink round smooth lesion left lower vag opening without vesicle and  not warty .  Excoriated a bit. No abscess   PSYCH: pleasant and cooperative, no obvious depression or anxiety  ASSESSMENT AND PLAN:  Discussed the following assessment and plan:  1. Bump left perineum    pos plugged gland left lower peineum about  3 mm   smooth irritative observation evaluated if continues   -Patient advised to return or notify health care team  immediately if symptoms worsen or persist or new concerns arise.  Patient Instructions  Thi small bump may go away with time nd may be a plugged glandular area.   Warm compresses  2-3 x per day minimum   Foe the next week or so .   If  persistent or progressive after 3 -4 weeks  Lets recheck and make a decision about further evaluation.    Neta Mends. Panosh M.D.

## 2012-04-06 ENCOUNTER — Encounter: Payer: Self-pay | Admitting: Internal Medicine

## 2012-05-12 ENCOUNTER — Ambulatory Visit: Payer: BC Managed Care – PPO | Admitting: Internal Medicine

## 2012-05-15 ENCOUNTER — Ambulatory Visit (INDEPENDENT_AMBULATORY_CARE_PROVIDER_SITE_OTHER): Payer: BC Managed Care – PPO | Admitting: Internal Medicine

## 2012-05-15 ENCOUNTER — Encounter: Payer: Self-pay | Admitting: Internal Medicine

## 2012-05-15 VITALS — BP 120/78 | HR 72 | Temp 98.0°F | Wt 133.0 lb

## 2012-05-15 DIAGNOSIS — R229 Localized swelling, mass and lump, unspecified: Secondary | ICD-10-CM

## 2012-05-15 DIAGNOSIS — J309 Allergic rhinitis, unspecified: Secondary | ICD-10-CM

## 2012-05-15 DIAGNOSIS — J019 Acute sinusitis, unspecified: Secondary | ICD-10-CM

## 2012-05-15 DIAGNOSIS — J069 Acute upper respiratory infection, unspecified: Secondary | ICD-10-CM

## 2012-05-15 DIAGNOSIS — IMO0001 Reserved for inherently not codable concepts without codable children: Secondary | ICD-10-CM

## 2012-05-15 MED ORDER — AMOXICILLIN-POT CLAVULANATE 875-125 MG PO TABS: 1.0000 | ORAL_TABLET | Freq: Two times a day (BID) | ORAL | Status: DC

## 2012-05-15 NOTE — Patient Instructions (Addendum)
Still uncertain about the bump area but looks smaller to me . Can continue to follow  .  Can recheck at   Next preventive check or as needed  Treat for bacterial sinusitis. Should improve with in 5 days . contact us if not helping or as needed

## 2012-05-15 NOTE — Progress Notes (Signed)
Chief Complaint  Patient presents with  . Follow-up    Also believes she has a sinus infection with yellow drainage.    HPI: Fu of bump on perineum and also has uri  Poss sinusitis   Onset for 6 weeks    Nasal discharge  creamy and color  Yellow    No blood  No fever  Some cough at times.  Some face pain. Has used otcs  Hx of  Allergies    Bump area ? Smaller  But feels skin thickened and no pain or dc  ROS: See pertinent positives and negatives per HPI. No fever cough cp sob new rashes   Past Medical History  Diagnosis Date  . Exercise-induced asthma   . Breast cyst   . Treadmill stress test negative for angina pectoris     neg myoview 2008  . Desensitization to allergy shot     hx of as a child   . Rectal bleeding 07/09/2010    Probably was beet juice      Family History  Problem Relation Age of Onset  . Anemia Mother   . Parkinsonism Mother 45  . Coronary artery disease Father 21  . Healthy Sister   . Healthy Brother   . Healthy Sister   . Healthy Sister     History   Social History  . Marital Status: Married    Spouse Name: N/A    Number of Children: N/A  . Years of Education: N/A   Social History Main Topics  . Smoking status: Former Games developer  . Smokeless tobacco: Never Used  . Alcohol Use: Yes     Comment: occasionally   . Drug Use: None  . Sexually Active: None   Other Topics Concern  . None   Social History Narrative   Married   Former smoker   Regular Exercise- yes   hh of 3   2 cats   Child biirth x 2                 Outpatient Encounter Prescriptions as of 05/15/2012  Medication Sig Dispense Refill  . PROAIR HFA 108 (90 BASE) MCG/ACT inhaler use as directed  8.5 g  1  . amoxicillin-clavulanate (AUGMENTIN) 875-125 MG per tablet Take 1 tablet by mouth every 12 (twelve) hours. for sinusitis  20 tablet  0   No facility-administered encounter medications on file as of 05/15/2012.    EXAM:  BP 120/78  Pulse 72  Temp(Src) 98 F (36.7 C)  (Oral)  Wt 133 lb (60.328 kg)  BMI 20.83 kg/m2  SpO2 98%  Body mass index is 20.83 kg/(m^2). WDWN in NAD  quiet respirations; mildly congested  . Non toxic . HEENT: Normocephalic ;atraumatic , Eyes;  PERRL, EOMs  Full, lids and conjunctiva clear,,Ears: no deformities, canals nl, TM landmarks normal, Nose: no deformity or discharge but congested;face minimally tender Mouth : OP clear without lesion or edema . Neck: Supple without adenopathy or masses or bruits Chest:  Clear to A&P without wheezes rales or rhonchi CV:  S1-S2 no gallops or murmurs peripheral perfusion is normal Skin :nl perfusion and no acute rashe Ext gu  Left perineum small 1-2 mm flesh coloerd bump ? Center or not  Non tender but palpable   ? If anu thickened skin laterally .  No adenopathy  MS: moves all extremities without noticeable focal  abnormality  PSYCH: pleasant and cooperative, no obvious depression or anxiety  ASSESSMENT AND PLAN:  Discussed  the following assessment and plan:  Bump left perineum  Protracted URI  Acute sinusitis with symptoms greater than 10 days  Allergic rhinitis - probable also   -Patient advised to return or notify health care team  if symptoms worsen or persist or new concerns arise.  Patient Instructions  Still uncertain about the bump area but looks smaller to me . Can continue to follow  .  Can recheck at   Next preventive check or as needed  Treat for bacterial sinusitis. Should improve with in 5 days . contact us if not helping or as needed   Neta Mends. Panosh M.D.

## 2012-05-18 DIAGNOSIS — J019 Acute sinusitis, unspecified: Secondary | ICD-10-CM | POA: Insufficient documentation

## 2012-05-18 DIAGNOSIS — J309 Allergic rhinitis, unspecified: Secondary | ICD-10-CM | POA: Insufficient documentation

## 2012-06-23 ENCOUNTER — Encounter: Payer: Self-pay | Admitting: Internal Medicine

## 2012-06-23 NOTE — Telephone Encounter (Signed)
Office visit  Needed ? If she wants to go to Saturday clinic  Vs appt on Monday.  Her last rx was early April .

## 2012-07-24 ENCOUNTER — Encounter: Payer: Self-pay | Admitting: Internal Medicine

## 2012-07-24 ENCOUNTER — Ambulatory Visit (INDEPENDENT_AMBULATORY_CARE_PROVIDER_SITE_OTHER): Payer: BC Managed Care – PPO | Admitting: Internal Medicine

## 2012-07-24 VITALS — BP 116/70 | HR 66 | Temp 98.1°F | Wt 133.0 lb

## 2012-07-24 DIAGNOSIS — J019 Acute sinusitis, unspecified: Secondary | ICD-10-CM

## 2012-07-24 DIAGNOSIS — J309 Allergic rhinitis, unspecified: Secondary | ICD-10-CM

## 2012-07-24 DIAGNOSIS — J0100 Acute maxillary sinusitis, unspecified: Secondary | ICD-10-CM

## 2012-07-24 DIAGNOSIS — W57XXXA Bitten or stung by nonvenomous insect and other nonvenomous arthropods, initial encounter: Secondary | ICD-10-CM

## 2012-07-24 MED ORDER — DOXYCYCLINE HYCLATE 100 MG PO CAPS: 100.0000 mg | ORAL_CAPSULE | Freq: Two times a day (BID) | ORAL | Status: DC

## 2012-07-24 MED ORDER — PREDNISONE 20 MG PO TABS: ORAL_TABLET | ORAL | Status: DC

## 2012-07-24 MED ORDER — AMOXICILLIN-POT CLAVULANATE 875-125 MG PO TABS: 1.0000 | ORAL_TABLET | Freq: Two times a day (BID) | ORAL | Status: DC

## 2012-07-24 NOTE — Patient Instructions (Addendum)
Take steroid burst and  Antibiotic    Doxy instead of the augmentin  Contact us if not a lot better after   A week or so .  Consider taking nasacort   Every day after Trearment   to continue help with chronic sinus sx and avoid relapse.   Monitor  rash    doxycycline would help any local infection or tick related   Disease if present.

## 2012-07-24 NOTE — Progress Notes (Signed)
Chief Complaint  Patient presents with  . Nasal Congestion    Nasal congestion is yellow.  Cough is also productive of a yellow sputum.  Has been treated for this recently.  Pt reports given Aumentin.  Marland Kitchen Cough  . Fatigue    HPI: Patient comes in today for SDA for  new problem evaluation. ocass  Pressure no fever   Sig face pain.   No cp sob.   Using netti pot  Took Claritin for a while  minimal helps .  Since march had antibiotic and never totally  Resolved  Mucous cleared but then relapsed  And continuing  Chronic deep cough yellow and such .     ALSO    had some type of bite for days left neck was sore itchy and now better but redness and rash extending  No fever  ROS: See pertinent positives and negatives per HPI. No cp sob no fever   No hemoptysis  Has hx of steroid use for sinus infection in the past  Past Medical History  Diagnosis Date  . Exercise-induced asthma   . Breast cyst   . Treadmill stress test negative for angina pectoris     neg myoview 2008  . Desensitization to allergy shot     hx of as a child   . Rectal bleeding 07/09/2010    Probably was beet juice      Family History  Problem Relation Age of Onset  . Anemia Mother   . Parkinsonism Mother 84  . Coronary artery disease Father 3  . Healthy Sister   . Healthy Brother   . Healthy Sister   . Healthy Sister     History   Social History  . Marital Status: Married    Spouse Name: N/A    Number of Children: N/A  . Years of Education: N/A   Social History Main Topics  . Smoking status: Former Games developer  . Smokeless tobacco: Never Used  . Alcohol Use: Yes     Comment: occasionally   . Drug Use: None  . Sexually Active: None   Other Topics Concern  . None   Social History Narrative   Married   Former smoker   Regular Exercise- yes   hh of 3   2 cats   Child biirth x 2                 Outpatient Encounter Prescriptions as of 07/24/2012  Medication Sig Dispense Refill  . PROAIR HFA 108  (90 BASE) MCG/ACT inhaler use as directed  8.5 g  1  . amoxicillin-clavulanate (AUGMENTIN) 875-125 MG per tablet Take 1 tablet by mouth every 12 (twelve) hours. for sinusitis  20 tablet  0  . doxycycline (VIBRAMYCIN) 100 MG capsule Take 1 capsule (100 mg total) by mouth 2 (two) times daily.  20 capsule  0  . predniSONE (DELTASONE) 20 MG tablet Take 3 po qd for 2 days then 2 po qd for 3 days,or as directed  12 tablet  0  . [DISCONTINUED] amoxicillin-clavulanate (AUGMENTIN) 875-125 MG per tablet Take 1 tablet by mouth every 12 (twelve) hours. for sinusitis  20 tablet  0   No facility-administered encounter medications on file as of 07/24/2012.    EXAM:  BP 116/70  Pulse 66  Temp(Src) 98.1 F (36.7 C) (Oral)  Wt 133 lb (60.328 kg)  BMI 20.83 kg/m2  SpO2 99%  Body mass index is 20.83 kg/(m^2).  GENERAL: vitals reviewed and listed  above, alert, oriented, appears well hydrated and in no acute distress congested non toxic  HEENT: atraumatic, conjunctiva  clear, no obvious abnormalities on inspection of external nose and ears  tms clear  Nares congested no face pain OP : no lesion edema or exudate  cobblestoneing  NECK: no obvious masses on inspection palpation  No adenopathy sig  LUNGS: clear to auscultation bilaterally, no wheezes, rales or rhonchi, good air movement Skin: left neck with about 6-7 cm  Flat erythema round  With descrete border  Cental palpable puncate are no vesicle and no clearing  Or scaling  CV: HRRR, no clubbing cyanosis or  peripheral edema nl cap refill  MS: moves all extremities without noticeable focal  abnormality PSYCH: pleasant and cooperative, no obvious depression or anxiety  ASSESSMENT AND PLAN:  Discussed the following assessment and plan:  Subacute maxillary sinusitis - improve dwith antibiotic not resolved now ongoing for 2 months  Insect bite with surroiunding rash   Acute sinusitis with symptoms greater than 10 days  Allergic rhinitis Pt disc of  oral vs inj steroids  Prefer least amount possible  In the short run but porb under;lyingallergy causing issues   Uncertain rash flat and large with center suunbern like   About 5-7 cm  Cover for  Tick disease   adn sinus infection  With doxycycline  and sinus  -Patient advised to return or notify health care team  if symptoms worsen or persist or new concerns arise.  Patient Instructions  Take steroid burst and  Antibiotic    Doxy instead of the augmentin  Contact us if not a lot better after   A week or so .  Consider taking nasacort   Every day after Trearment   to continue help with chronic sinus sx and avoid relapse.   Monitor  rash    doxycycline would help any local infection or tick related   Disease if present.    Neta Mends. Giovannina Mun M.D.

## 2012-08-15 ENCOUNTER — Ambulatory Visit (INDEPENDENT_AMBULATORY_CARE_PROVIDER_SITE_OTHER): Payer: BC Managed Care – PPO | Admitting: Internal Medicine

## 2012-08-15 ENCOUNTER — Encounter: Payer: Self-pay | Admitting: Internal Medicine

## 2012-08-15 VITALS — BP 112/76 | HR 72 | Temp 97.6°F | Ht 66.75 in | Wt 132.0 lb

## 2012-08-15 DIAGNOSIS — R229 Localized swelling, mass and lump, unspecified: Secondary | ICD-10-CM

## 2012-08-15 DIAGNOSIS — J309 Allergic rhinitis, unspecified: Secondary | ICD-10-CM

## 2012-08-15 DIAGNOSIS — Z0000 Encounter for general adult medical examination without abnormal findings: Secondary | ICD-10-CM

## 2012-08-15 DIAGNOSIS — J0100 Acute maxillary sinusitis, unspecified: Secondary | ICD-10-CM

## 2012-08-15 LAB — LIPID PANEL
HDL: 73.8 mg/dL (ref >39.00)
Total CHOL/HDL Ratio: 3
VLDL: 10.8 mg/dL (ref 0.0–40.0)

## 2012-08-15 LAB — HEPATIC FUNCTION PANEL
Alkaline Phosphatase: 52 U/L (ref 39–117)
Bilirubin, Direct: 0 mg/dL (ref 0.0–0.3)
Total Protein: 6.8 g/dL (ref 6.0–8.3)

## 2012-08-15 LAB — CBC WITH DIFFERENTIAL/PLATELET
Basophils Relative: 0.8 % (ref 0.0–3.0)
Eosinophils Absolute: 0.1 10*3/uL (ref 0.0–0.7)
Lymphocytes Relative: 33 % (ref 12.0–46.0)
MCHC: 33.7 g/dL (ref 30.0–36.0)
Neutrophils Relative %: 56.3 % (ref 43.0–77.0)
RBC: 4.05 Mil/uL (ref 3.87–5.11)
WBC: 4.4 10*3/uL — ABNORMAL LOW (ref 4.5–10.5)

## 2012-08-15 LAB — TSH: TSH: 1.99 u[IU]/mL (ref 0.35–5.50)

## 2012-08-15 LAB — LDL CHOLESTEROL, DIRECT: Direct LDL: 185.9 mg/dL

## 2012-08-15 LAB — BASIC METABOLIC PANEL
CO2: 26 mEq/L (ref 19–32)
Chloride: 106 mEq/L (ref 96–112)
Creatinine, Ser: 1 mg/dL (ref 0.4–1.2)
Potassium: 4.2 mEq/L (ref 3.5–5.1)

## 2012-08-15 MED ORDER — LEVOFLOXACIN 750 MG PO TABS: 750.0000 mg | ORAL_TABLET | Freq: Every day | ORAL | Status: DC

## 2012-08-15 NOTE — Progress Notes (Signed)
Chief Complaint  Patient presents with  . Annual Exam    Has not had lab work    HPI: Patient comes in today for Preventive Health Care visit   Since last visit  Sinus  Antibiotic and prednisone  And gradually cpoming . nasacort   Still taking.  About 65% better but some of her symptoms are returning off of the antibiotic and prednisone  Check bump   the area and in the vagina perineal may be about the same no new symptoms.  ROS:  GEN/ HEENT: No fever, significant weight changes sweats headaches vision problems hearing changes, CV/ PULM; No chest pain shortness of breath cough, syncope,edema  change in exercise tolerance. GI /GU: No adominal pain, vomiting, change in bowel habits. No blood in the stool. No significant GU symptoms. SKIN/HEME: ,no acute skin rashes suspicious lesions or bleeding. No lymphadenopathy, nodules, masses.  NEURO/ PSYCH:  No neurologic signs such as weakness numbness. No depression anxiety. IMM/ Allergy: No unusual infections.  Allergy .   REST of 12 system review negative except as per HPI   Past Medical History  Diagnosis Date  . Exercise-induced asthma   . Breast cyst   . Treadmill stress test negative for angina pectoris     neg myoview 2008  . Desensitization to allergy shot     hx of as a child   . Rectal bleeding 07/09/2010    Probably was beet juice      Family History  Problem Relation Age of Onset  . Anemia Mother   . Parkinsonism Mother 61  . Coronary artery disease Father 32  . Healthy Sister   . Healthy Brother   . Healthy Sister   . Healthy Sister    Past Surgical History  Procedure Laterality Date  . Myoview stess      negative 2008  . Childbirth x 2    . Hx allergy shots    . Left knee reconstruction  1989   Past Surgical History  Procedure Laterality Date  . Myoview stess      negative 2008  . Childbirth x 2    . Hx allergy shots    . Left knee reconstruction  1989    History   Social History  . Marital  Status: Married    Spouse Name: N/A    Number of Children: N/A  . Years of Education: N/A   Social History Main Topics  . Smoking status: Former Games developer  . Smokeless tobacco: Never Used  . Alcohol Use: Yes     Comment: occasionally   . Drug Use: None  . Sexually Active: None   Other Topics Concern  . None   Social History Narrative   Married   Former smoker   Regular Exercise- yes   hh of 3   2 cats   Child biirth x 2                 Outpatient Encounter Prescriptions as of 08/15/2012  Medication Sig Dispense Refill  . levofloxacin (LEVAQUIN) 750 MG tablet Take 1 tablet (750 mg total) by mouth daily.  7 tablet  0  . [DISCONTINUED] amoxicillin-clavulanate (AUGMENTIN) 875-125 MG per tablet Take 1 tablet by mouth every 12 (twelve) hours. for sinusitis  20 tablet  0  . [DISCONTINUED] doxycycline (VIBRAMYCIN) 100 MG capsule Take 1 capsule (100 mg total) by mouth 2 (two) times daily.  20 capsule  0  . [DISCONTINUED] predniSONE (DELTASONE) 20 MG tablet Take  3 po qd for 2 days then 2 po qd for 3 days,or as directed  12 tablet  0  . [DISCONTINUED] PROAIR HFA 108 (90 BASE) MCG/ACT inhaler use as directed  8.5 g  1   No facility-administered encounter medications on file as of 08/15/2012.    EXAM:  BP 112/76  Pulse 72  Temp(Src) 97.6 F (36.4 C) (Oral)  Ht 5' 6.75" (1.695 m)  Wt 132 lb (59.875 kg)  BMI 20.84 kg/m2  SpO2 98%  Body mass index is 20.84 kg/(m^2).  Physical Exam: Vital signs reviewed [id number] is a well-developed well-nourished alert cooperative   female who appears her stated age in no acute distress. She still appears somewhat congested but nontoxic HEENT: normocephalic atraumatic , Eyes: PERRL EOM's full, conjunctiva clear, Nares: paten,t no deformity discharge or tenderness. Somewhat congested, Ears: no deformity EAC's clear TMs with normal landmarks. Mouth: clear OP, no lesions, edema.  Moist mucous membranes. Dentition in adequate repair. NECK: supple without  masses, thyromegaly or bruits. Breast: normal by inspection . No dimpling, discharge, masses, tenderness or discharge . CHEST/PULM:  Clear to auscultation and percussion breath sounds equal no wheeze , rales or rhonchi. No chest wall deformities or tenderness. CV: PMI is nondisplaced, S1 S2 no gallops, murmurs, rubs. Peripheral pulses are full without delay.No JVD .  ABDOMEN: Bowel sounds normal nontender  No guard or rebound, no hepato splenomegal no CVA tenderness.  No hernia. Extremtities:  No clubbing cyanosis or edema, no acute joint swelling or redness no focal atrophy NEURO:  Oriented x3, cranial nerves 3-12 appear to be intact, no obvious focal weakness,gait within normal limits no abnormal reflexes or asymmetrical SKIN: No acute rashes normal turgor, color, no bruising or petechiae. PSYCH: Oriented, good eye contact, no obvious depression anxiety, cognition and judgment appear normal. LN: no cervical axillary inguinal adenopathy External GU probably normal but there is an area of a rough bump with skin feeling thickened on the left  perineum  Lab Results  Component Value Date   WBC 4.4* 08/15/2012   HGB 13.0 08/15/2012   HCT 38.5 08/15/2012   PLT 227.0 08/15/2012   GLUCOSE 81 08/15/2012   CHOL 257* 08/15/2012   TRIG 54.0 08/15/2012   HDL 73.80 08/15/2012   LDLDIRECT 185.9 08/15/2012   ALT 22 08/15/2012   AST 24 08/15/2012   NA 140 08/15/2012   K 4.2 08/15/2012   CL 106 08/15/2012   CREATININE 1.0 08/15/2012   BUN 15 08/15/2012   CO2 26 08/15/2012   TSH 1.99 08/15/2012    ASSESSMENT AND PLAN:  Discussed the following assessment and plan:  Visit for preventive health examination - Plan: Basic metabolic panel, CBC with Differential, Hepatic function panel, Lipid panel, TSH  Allergic rhinitis - Contributing  Subacute maxillary sinusitis - Partial improvement on Augmentin and prednisone not better possibly relapsing given Levaquin we'll do an ENT referral stay on nasal cortisone - Plan: Ambulatory referral  to ENT  Bump left perineum - uncertain  if benign cause  thickened  on palpationat that area   get gyne to check. - Plan: Ambulatory referral to Gynecology Uncertain cause of the peroneal exam this could be normal but would feel better if GYN checks the area. Patient Care Team: Madelin Headings, MD as PCP - General Elmon Else, MD as Attending Physician (Dermatology) Patient Instructions   Will arrange ent consult about the sinuses   Still could have allergy  If your sinus symptoms continue to worsen start the  second antibiotic.  We'll arrange GYN to check the rough bump that we had been following.  Otherwise continue healthy lifestyle we'll itching about lab results.  Recheck in a year or as needed.    Preventive Care for Adults, Female A healthy lifestyle and preventive care can promote health and wellness. Preventive health guidelines for women include the following key practices.  A routine yearly physical is a good way to check with your caregiver about your health and preventive screening. It is a chance to share any concerns and updates on your health, and to receive a thorough exam.  Visit your dentist for a routine exam and preventive care every 6 months. Brush your teeth twice a day and floss once a day. Good oral hygiene prevents tooth decay and gum disease.  The frequency of eye exams is based on your age, health, family medical history, use of contact lenses, and other factors. Follow your caregiver's recommendations for frequency of eye exams.  Eat a healthy diet. Foods like vegetables, fruits, whole grains, low-fat dairy products, and lean protein foods contain the nutrients you need without too many calories. Decrease your intake of foods high in solid fats, added sugars, and salt. Eat the right amount of calories for you.Get information about a proper diet from your caregiver, if necessary.  Regular physical exercise is one of the most important things you can do for  your health. Most adults should get at least 150 minutes of moderate-intensity exercise (any activity that increases your heart rate and causes you to sweat) each week. In addition, most adults need muscle-strengthening exercises on 2 or more days a week.  Maintain a healthy weight. The body mass index (BMI) is a screening tool to identify possible weight problems. It provides an estimate of body fat based on height and weight. Your caregiver can help determine your BMI, and can help you achieve or maintain a healthy weight.For adults 20 years and older:  A BMI below 18.5 is considered underweight.  A BMI of 18.5 to 24.9 is normal.  A BMI of 25 to 29.9 is considered overweight.  A BMI of 30 and above is considered obese.  Maintain normal blood lipids and cholesterol levels by exercising and minimizing your intake of saturated fat. Eat a balanced diet with plenty of fruit and vegetables. Blood tests for lipids and cholesterol should begin at age 42 and be repeated every 5 years. If your lipid or cholesterol levels are high, you are over 50, or you are at high risk for heart disease, you may need your cholesterol levels checked more frequently.Ongoing high lipid and cholesterol levels should be treated with medicines if diet and exercise are not effective.  If you smoke, find out from your caregiver how to quit. If you do not use tobacco, do not start.  If you are pregnant, do not drink alcohol. If you are breastfeeding, be very cautious about drinking alcohol. If you are not pregnant and choose to drink alcohol, do not exceed 1 drink per day. One drink is considered to be 12 ounces (355 mL) of beer, 5 ounces (148 mL) of wine, or 1.5 ounces (44 mL) of liquor.  Avoid use of street drugs. Do not share needles with anyone. Ask for help if you need support or instructions about stopping the use of drugs.  High blood pressure causes heart disease and increases the risk of stroke. Your blood pressure  should be checked at least every 1 to 2 years. Ongoing  high blood pressure should be treated with medicines if weight loss and exercise are not effective.  If you are 27 to 58 years old, ask your caregiver if you should take aspirin to prevent strokes.  Diabetes screening involves taking a blood sample to check your fasting blood sugar level. This should be done once every 3 years, after age 61, if you are within normal weight and without risk factors for diabetes. Testing should be considered at a younger age or be carried out more frequently if you are overweight and have at least 1 risk factor for diabetes.  Breast cancer screening is essential preventive care for women. You should practice "breast self-awareness." This means understanding the normal appearance and feel of your breasts and may include breast self-examination. Any changes detected, no matter how small, should be reported to a caregiver. Women in their 32s and 30s should have a clinical breast exam (CBE) by a caregiver as part of a regular health exam every 1 to 3 years. After age 60, women should have a CBE every year. Starting at age 83, women should consider having a mammography (breast X-ray test) every year. Women who have a family history of breast cancer should talk to their caregiver about genetic screening. Women at a high risk of breast cancer should talk to their caregivers about having magnetic resonance imaging (MRI) and a mammography every year.  The Pap test is a screening test for cervical cancer. A Pap test can show cell changes on the cervix that might become cervical cancer if left untreated. A Pap test is a procedure in which cells are obtained and examined from the lower end of the uterus (cervix).  Women should have a Pap test starting at age 63.  Between ages 3 and 19, Pap tests should be repeated every 2 years.  Beginning at age 36, you should have a Pap test every 3 years as long as the past 3 Pap tests have  been normal.  Some women have medical problems that increase the chance of getting cervical cancer. Talk to your caregiver about these problems. It is especially important to talk to your caregiver if a new problem develops soon after your last Pap test. In these cases, your caregiver may recommend more frequent screening and Pap tests.  The above recommendations are the same for women who have or have not gotten the vaccine for human papillomavirus (HPV).  If you had a hysterectomy for a problem that was not cancer or a condition that could lead to cancer, then you no longer need Pap tests. Even if you no longer need a Pap test, a regular exam is a good idea to make sure no other problems are starting.  If you are between ages 21 and 53, and you have had normal Pap tests going back 10 years, you no longer need Pap tests. Even if you no longer need a Pap test, a regular exam is a good idea to make sure no other problems are starting.  If you have had past treatment for cervical cancer or a condition that could lead to cancer, you need Pap tests and screening for cancer for at least 20 years after your treatment.  If Pap tests have been discontinued, risk factors (such as a new sexual partner) need to be reassessed to determine if screening should be resumed.  The HPV test is an additional test that may be used for cervical cancer screening. The HPV test looks for the virus  that can cause the cell changes on the cervix. The cells collected during the Pap test can be tested for HPV. The HPV test could be used to screen women aged 28 years and older, and should be used in women of any age who have unclear Pap test results. After the age of 62, women should have HPV testing at the same frequency as a Pap test.  Colorectal cancer can be detected and often prevented. Most routine colorectal cancer screening begins at the age of 76 and continues through age 39. However, your caregiver may recommend  screening at an earlier age if you have risk factors for colon cancer. On a yearly basis, your caregiver may provide home test kits to check for hidden blood in the stool. Use of a small camera at the end of a tube, to directly examine the colon (sigmoidoscopy or colonoscopy), can detect the earliest forms of colorectal cancer. Talk to your caregiver about this at age 2, when routine screening begins. Direct examination of the colon should be repeated every 5 to 10 years through age 49, unless early forms of pre-cancerous polyps or small growths are found.  Hepatitis C blood testing is recommended for all people born from [birth year] through 1965 and any individual with known risks for hepatitis C.  Practice safe sex. Use condoms and avoid high-risk sexual practices to reduce the spread of sexually transmitted infections (STIs). STIs include gonorrhea, chlamydia, syphilis, trichomonas, herpes, HPV, and human immunodeficiency virus (HIV). Herpes, HIV, and HPV are viral illnesses that have no cure. They can result in disability, cancer, and death. Sexually active women aged 75 and younger should be checked for chlamydia. Older women with new or multiple partners should also be tested for chlamydia. Testing for other STIs is recommended if you are sexually active and at increased risk.  Osteoporosis is a disease in which the bones lose minerals and strength with aging. This can result in serious bone fractures. The risk of osteoporosis can be identified using a bone density scan. Women ages 63 and over and women at risk for fractures or osteoporosis should discuss screening with their caregivers. Ask your caregiver whether you should take a calcium supplement or vitamin D to reduce the rate of osteoporosis.  Menopause can be associated with physical symptoms and risks. Hormone replacement therapy is available to decrease symptoms and risks. You should talk to your caregiver about whether hormone replacement therapy  is right for you.  Use sunscreen with sun protection factor (SPF) of 30 or more. Apply sunscreen liberally and repeatedly throughout the day. You should seek shade when your shadow is shorter than you. Protect yourself by wearing long sleeves, pants, a wide-brimmed hat, and sunglasses year round, whenever you are outdoors.  Once a month, do a whole body skin exam, using a mirror to look at the skin on your back. Notify your caregiver of new moles, moles that have irregular borders, moles that are larger than a pencil eraser, or moles that have changed in shape or color.  Stay current with required immunizations.  Influenza. You need a dose every fall (or winter). The composition of the flu vaccine changes each year, so being vaccinated once is not enough.  Pneumococcal polysaccharide. You need 1 to 2 doses if you smoke cigarettes or if you have certain chronic medical conditions. You need 1 dose at age 36 (or older) if you have never been vaccinated.  Tetanus, diphtheria, pertussis (Tdap, Td). Get 1 dose of Tdap vaccine  if you are younger than age 52, are over 74 and have contact with an infant, are a Research scientist (physical sciences), are pregnant, or simply want to be protected from whooping cough. After that, you need a Td booster dose every 10 years. Consult your caregiver if you have not had at least 3 tetanus and diphtheria-containing shots sometime in your life or have a deep or dirty wound.  HPV. You need this vaccine if you are a woman age 82 or younger. The vaccine is given in 3 doses over 6 months.  Measles, mumps, rubella (MMR). You need at least 1 dose of MMR if you were born in 1957 or later. You may also need a second dose.  Meningococcal. If you are age 53 to 58 and a first-year college student living in a residence hall, or have one of several medical conditions, you need to get vaccinated against meningococcal disease. You may also need additional booster doses.  Zoster (shingles). If you are  age 19 or older, you should get this vaccine.  Varicella (chickenpox). If you have never had chickenpox or you were vaccinated but received only 1 dose, talk to your caregiver to find out if you need this vaccine.  Hepatitis A. You need this vaccine if you have a specific risk factor for hepatitis A virus infection or you simply wish to be protected from this disease. The vaccine is usually given as 2 doses, 6 to 18 months apart.  Hepatitis B. You need this vaccine if you have a specific risk factor for hepatitis B virus infection or you simply wish to be protected from this disease. The vaccine is given in 3 doses, usually over 6 months. Preventive Services / Frequency Ages 49 to 48  Blood pressure check.** / Every 1 to 2 years.  Lipid and cholesterol check.** / Every 5 years beginning at age 32.  Clinical breast exam.** / Every 3 years for women in their 53s and 30s.  Pap test.** / Every 2 years from ages 53 through 25. Every 3 years starting at age 80 through age 39 or 84 with a history of 3 consecutive normal Pap tests.  HPV screening.** / Every 3 years from ages 49 through ages 71 to 88 with a history of 3 consecutive normal Pap tests.  Hepatitis C blood test.** / For any individual with known risks for hepatitis C.  Skin self-exam. / Monthly.  Influenza immunization.** / Every year.  Pneumococcal polysaccharide immunization.** / 1 to 2 doses if you smoke cigarettes or if you have certain chronic medical conditions.  Tetanus, diphtheria, pertussis (Tdap, Td) immunization. / A one-time dose of Tdap vaccine. After that, you need a Td booster dose every 10 years.  HPV immunization. / 3 doses over 6 months, if you are 75 and younger.  Measles, mumps, rubella (MMR) immunization. / You need at least 1 dose of MMR if you were born in 1957 or later. You may also need a second dose.  Meningococcal immunization. / 1 dose if you are age 60 to 42 and a first-year college student living in  a residence hall, or have one of several medical conditions, you need to get vaccinated against meningococcal disease. You may also need additional booster doses.  Varicella immunization.** / Consult your caregiver.  Hepatitis A immunization.** / Consult your caregiver. 2 doses, 6 to 18 months apart.  Hepatitis B immunization.** / Consult your caregiver. 3 doses usually over 6 months. Ages 74 to 80  Blood pressure check.** /  Every 1 to 2 years.  Lipid and cholesterol check.** / Every 5 years beginning at age 23.  Clinical breast exam.** / Every year after age 78.  Mammogram.** / Every year beginning at age 55 and continuing for as long as you are in good health. Consult with your caregiver.  Pap test.** / Every 3 years starting at age 67 through age 35 or 7 with a history of 3 consecutive normal Pap tests.  HPV screening.** / Every 3 years from ages 71 through ages 34 to 53 with a history of 3 consecutive normal Pap tests.  Fecal occult blood test (FOBT) of stool. / Every year beginning at age 85 and continuing until age 40. You may not need to do this test if you get a colonoscopy every 10 years.  Flexible sigmoidoscopy or colonoscopy.** / Every 5 years for a flexible sigmoidoscopy or every 10 years for a colonoscopy beginning at age 71 and continuing until age 57.  Hepatitis C blood test.** / For all people born from [birth year] through 1965 and any individual with known risks for hepatitis C.  Skin self-exam. / Monthly.  Influenza immunization.** / Every year.  Pneumococcal polysaccharide immunization.** / 1 to 2 doses if you smoke cigarettes or if you have certain chronic medical conditions.  Tetanus, diphtheria, pertussis (Tdap, Td) immunization.** / A one-time dose of Tdap vaccine. After that, you need a Td booster dose every 10 years.  Measles, mumps, rubella (MMR) immunization. / You need at least 1 dose of MMR if you were born in 1957 or later. You may also need a second  dose.  Varicella immunization.** / Consult your caregiver.  Meningococcal immunization.** / Consult your caregiver.  Hepatitis A immunization.** / Consult your caregiver. 2 doses, 6 to 18 months apart.  Hepatitis B immunization.** / Consult your caregiver. 3 doses, usually over 6 months. Ages 33 and over  Blood pressure check.** / Every 1 to 2 years.  Lipid and cholesterol check.** / Every 5 years beginning at age 60.  Clinical breast exam.** / Every year after age 67.  Mammogram.** / Every year beginning at age 20 and continuing for as long as you are in good health. Consult with your caregiver.  Pap test.** / Every 3 years starting at age 21 through age 20 or 24 with a 3 consecutive normal Pap tests. Testing can be stopped between 65 and 70 with 3 consecutive normal Pap tests and no abnormal Pap or HPV tests in the past 10 years.  HPV screening.** / Every 3 years from ages 52 through ages 65 or 65 with a history of 3 consecutive normal Pap tests. Testing can be stopped between 65 and 70 with 3 consecutive normal Pap tests and no abnormal Pap or HPV tests in the past 10 years.  Fecal occult blood test (FOBT) of stool. / Every year beginning at age 54 and continuing until age 30. You may not need to do this test if you get a colonoscopy every 10 years.  Flexible sigmoidoscopy or colonoscopy.** / Every 5 years for a flexible sigmoidoscopy or every 10 years for a colonoscopy beginning at age 52 and continuing until age 7.  Hepatitis C blood test.** / For all people born from [birth year] through 1965 and any individual with known risks for hepatitis C.  Osteoporosis screening.** / A one-time screening for women ages 27 and over and women at risk for fractures or osteoporosis.  Skin self-exam. / Monthly.  Influenza immunization.** / Every year.  Pneumococcal polysaccharide immunization.** / 1 dose at age 43 (or older) if you have never been vaccinated.  Tetanus, diphtheria, pertussis  (Tdap, Td) immunization. / A one-time dose of Tdap vaccine if you are over 65 and have contact with an infant, are a Research scientist (physical sciences), or simply want to be protected from whooping cough. After that, you need a Td booster dose every 10 years.  Varicella immunization.** / Consult your caregiver.  Meningococcal immunization.** / Consult your caregiver.  Hepatitis A immunization.** / Consult your caregiver. 2 doses, 6 to 18 months apart.  Hepatitis B immunization.** / Check with your caregiver. 3 doses, usually over 6 months. ** Family history and personal history of risk and conditions may change your caregiver's recommendations. Document Released: 03/23/2001 Document Revised: 04/19/2011 Document Reviewed: 06/22/2010 [facility] Patient Information 2014 [employer], [place].     Neta Mends. Eshani Maestre M.D. Health Maintenance  Topic Date Due  . Influenza Vaccine  10/09/2012  . Mammogram  02/22/2014  . Pap Smear  05/31/2014  . Tetanus/tdap  06/23/2015  . Colonoscopy  10/29/2015   Health Maintenance Review

## 2012-08-15 NOTE — Patient Instructions (Addendum)
Will arrange ent consult about the sinuses   Still could have allergy  If your sinus symptoms continue to worsen start the second antibiotic.  We'll arrange GYN to check the rough bump that we had been following.  Otherwise continue healthy lifestyle we'll itching about lab results.  Recheck in a year or as needed.    Preventive Care for Adults, Female A healthy lifestyle and preventive care can promote health and wellness. Preventive health guidelines for women include the following key practices.  A routine yearly physical is a good way to check with your caregiver about your health and preventive screening. It is a chance to share any concerns and updates on your health, and to receive a thorough exam.  Visit your dentist for a routine exam and preventive care every 6 months. Brush your teeth twice a day and floss once a day. Good oral hygiene prevents tooth decay and gum disease.  The frequency of eye exams is based on your age, health, family medical history, use of contact lenses, and other factors. Follow your caregiver's recommendations for frequency of eye exams.  Eat a healthy diet. Foods like vegetables, fruits, whole grains, low-fat dairy products, and lean protein foods contain the nutrients you need without too many calories. Decrease your intake of foods high in solid fats, added sugars, and salt. Eat the right amount of calories for you.Get information about a proper diet from your caregiver, if necessary.  Regular physical exercise is one of the most important things you can do for your health. Most adults should get at least 150 minutes of moderate-intensity exercise (any activity that increases your heart rate and causes you to sweat) each week. In addition, most adults need muscle-strengthening exercises on 2 or more days a week.  Maintain a healthy weight. The body mass index (BMI) is a screening tool to identify possible weight problems. It provides an estimate of  body fat based on height and weight. Your caregiver can help determine your BMI, and can help you achieve or maintain a healthy weight.For adults 20 years and older:  A BMI below 18.5 is considered underweight.  A BMI of 18.5 to 24.9 is normal.  A BMI of 25 to 29.9 is considered overweight.  A BMI of 30 and above is considered obese.  Maintain normal blood lipids and cholesterol levels by exercising and minimizing your intake of saturated fat. Eat a balanced diet with plenty of fruit and vegetables. Blood tests for lipids and cholesterol should begin at age 49 and be repeated every 5 years. If your lipid or cholesterol levels are high, you are over 50, or you are at high risk for heart disease, you may need your cholesterol levels checked more frequently.Ongoing high lipid and cholesterol levels should be treated with medicines if diet and exercise are not effective.  If you smoke, find out from your caregiver how to quit. If you do not use tobacco, do not start.  If you are pregnant, do not drink alcohol. If you are breastfeeding, be very cautious about drinking alcohol. If you are not pregnant and choose to drink alcohol, do not exceed 1 drink per day. One drink is considered to be 12 ounces (355 mL) of beer, 5 ounces (148 mL) of wine, or 1.5 ounces (44 mL) of liquor.  Avoid use of street drugs. Do not share needles with anyone. Ask for help if you need support or instructions about stopping the use of drugs.  High blood pressure causes  heart disease and increases the risk of stroke. Your blood pressure should be checked at least every 1 to 2 years. Ongoing high blood pressure should be treated with medicines if weight loss and exercise are not effective.  If you are 37 to 57 years old, ask your caregiver if you should take aspirin to prevent strokes.  Diabetes screening involves taking a blood sample to check your fasting blood sugar level. This should be done once every 3 years, after age  53, if you are within normal weight and without risk factors for diabetes. Testing should be considered at a younger age or be carried out more frequently if you are overweight and have at least 1 risk factor for diabetes.  Breast cancer screening is essential preventive care for women. You should practice "breast self-awareness." This means understanding the normal appearance and feel of your breasts and may include breast self-examination. Any changes detected, no matter how small, should be reported to a caregiver. Women in their 35s and 30s should have a clinical breast exam (CBE) by a caregiver as part of a regular health exam every 1 to 3 years. After age 6, women should have a CBE every year. Starting at age 47, women should consider having a mammography (breast X-ray test) every year. Women who have a family history of breast cancer should talk to their caregiver about genetic screening. Women at a high risk of breast cancer should talk to their caregivers about having magnetic resonance imaging (MRI) and a mammography every year.  The Pap test is a screening test for cervical cancer. A Pap test can show cell changes on the cervix that might become cervical cancer if left untreated. A Pap test is a procedure in which cells are obtained and examined from the lower end of the uterus (cervix).  Women should have a Pap test starting at age 17.  Between ages 33 and 32, Pap tests should be repeated every 2 years.  Beginning at age 44, you should have a Pap test every 3 years as long as the past 3 Pap tests have been normal.  Some women have medical problems that increase the chance of getting cervical cancer. Talk to your caregiver about these problems. It is especially important to talk to your caregiver if a new problem develops soon after your last Pap test. In these cases, your caregiver may recommend more frequent screening and Pap tests.  The above recommendations are the same for women who  have or have not gotten the vaccine for human papillomavirus (HPV).  If you had a hysterectomy for a problem that was not cancer or a condition that could lead to cancer, then you no longer need Pap tests. Even if you no longer need a Pap test, a regular exam is a good idea to make sure no other problems are starting.  If you are between ages 57 and 10, and you have had normal Pap tests going back 10 years, you no longer need Pap tests. Even if you no longer need a Pap test, a regular exam is a good idea to make sure no other problems are starting.  If you have had past treatment for cervical cancer or a condition that could lead to cancer, you need Pap tests and screening for cancer for at least 20 years after your treatment.  If Pap tests have been discontinued, risk factors (such as a new sexual partner) need to be reassessed to determine if screening should be resumed.  The HPV test is an additional test that may be used for cervical cancer screening. The HPV test looks for the virus that can cause the cell changes on the cervix. The cells collected during the Pap test can be tested for HPV. The HPV test could be used to screen women aged 5 years and older, and should be used in women of any age who have unclear Pap test results. After the age of 36, women should have HPV testing at the same frequency as a Pap test.  Colorectal cancer can be detected and often prevented. Most routine colorectal cancer screening begins at the age of 54 and continues through age 2. However, your caregiver may recommend screening at an earlier age if you have risk factors for colon cancer. On a yearly basis, your caregiver may provide home test kits to check for hidden blood in the stool. Use of a small camera at the end of a tube, to directly examine the colon (sigmoidoscopy or colonoscopy), can detect the earliest forms of colorectal cancer. Talk to your caregiver about this at age 22, when routine screening begins.  Direct examination of the colon should be repeated every 5 to 10 years through age 76, unless early forms of pre-cancerous polyps or small growths are found.  Hepatitis C blood testing is recommended for all people born from [birth year] through 1965 and any individual with known risks for hepatitis C.  Practice safe sex. Use condoms and avoid high-risk sexual practices to reduce the spread of sexually transmitted infections (STIs). STIs include gonorrhea, chlamydia, syphilis, trichomonas, herpes, HPV, and human immunodeficiency virus (HIV). Herpes, HIV, and HPV are viral illnesses that have no cure. They can result in disability, cancer, and death. Sexually active women aged 17 and younger should be checked for chlamydia. Older women with new or multiple partners should also be tested for chlamydia. Testing for other STIs is recommended if you are sexually active and at increased risk.  Osteoporosis is a disease in which the bones lose minerals and strength with aging. This can result in serious bone fractures. The risk of osteoporosis can be identified using a bone density scan. Women ages 82 and over and women at risk for fractures or osteoporosis should discuss screening with their caregivers. Ask your caregiver whether you should take a calcium supplement or vitamin D to reduce the rate of osteoporosis.  Menopause can be associated with physical symptoms and risks. Hormone replacement therapy is available to decrease symptoms and risks. You should talk to your caregiver about whether hormone replacement therapy is right for you.  Use sunscreen with sun protection factor (SPF) of 30 or more. Apply sunscreen liberally and repeatedly throughout the day. You should seek shade when your shadow is shorter than you. Protect yourself by wearing long sleeves, pants, a wide-brimmed hat, and sunglasses year round, whenever you are outdoors.  Once a month, do a whole body skin exam, using a mirror to look at the skin  on your back. Notify your caregiver of new moles, moles that have irregular borders, moles that are larger than a pencil eraser, or moles that have changed in shape or color.  Stay current with required immunizations.  Influenza. You need a dose every fall (or winter). The composition of the flu vaccine changes each year, so being vaccinated once is not enough.  Pneumococcal polysaccharide. You need 1 to 2 doses if you smoke cigarettes or if you have certain chronic medical conditions. You need 1 dose at  age 51 (or older) if you have never been vaccinated.  Tetanus, diphtheria, pertussis (Tdap, Td). Get 1 dose of Tdap vaccine if you are younger than age 42, are over 46 and have contact with an infant, are a Research scientist (physical sciences), are pregnant, or simply want to be protected from whooping cough. After that, you need a Td booster dose every 10 years. Consult your caregiver if you have not had at least 3 tetanus and diphtheria-containing shots sometime in your life or have a deep or dirty wound.  HPV. You need this vaccine if you are a woman age 60 or younger. The vaccine is given in 3 doses over 6 months.  Measles, mumps, rubella (MMR). You need at least 1 dose of MMR if you were born in 1957 or later. You may also need a second dose.  Meningococcal. If you are age 72 to 52 and a first-year college student living in a residence hall, or have one of several medical conditions, you need to get vaccinated against meningococcal disease. You may also need additional booster doses.  Zoster (shingles). If you are age 51 or older, you should get this vaccine.  Varicella (chickenpox). If you have never had chickenpox or you were vaccinated but received only 1 dose, talk to your caregiver to find out if you need this vaccine.  Hepatitis A. You need this vaccine if you have a specific risk factor for hepatitis A virus infection or you simply wish to be protected from this disease. The vaccine is usually given as  2 doses, 6 to 18 months apart.  Hepatitis B. You need this vaccine if you have a specific risk factor for hepatitis B virus infection or you simply wish to be protected from this disease. The vaccine is given in 3 doses, usually over 6 months. Preventive Services / Frequency Ages 52 to 75  Blood pressure check.** / Every 1 to 2 years.  Lipid and cholesterol check.** / Every 5 years beginning at age 69.  Clinical breast exam.** / Every 3 years for women in their 73s and 30s.  Pap test.** / Every 2 years from ages 80 through 60. Every 3 years starting at age 29 through age 39 or 109 with a history of 3 consecutive normal Pap tests.  HPV screening.** / Every 3 years from ages 72 through ages 19 to 90 with a history of 3 consecutive normal Pap tests.  Hepatitis C blood test.** / For any individual with known risks for hepatitis C.  Skin self-exam. / Monthly.  Influenza immunization.** / Every year.  Pneumococcal polysaccharide immunization.** / 1 to 2 doses if you smoke cigarettes or if you have certain chronic medical conditions.  Tetanus, diphtheria, pertussis (Tdap, Td) immunization. / A one-time dose of Tdap vaccine. After that, you need a Td booster dose every 10 years.  HPV immunization. / 3 doses over 6 months, if you are 45 and younger.  Measles, mumps, rubella (MMR) immunization. / You need at least 1 dose of MMR if you were born in 1957 or later. You may also need a second dose.  Meningococcal immunization. / 1 dose if you are age 60 to 20 and a first-year college student living in a residence hall, or have one of several medical conditions, you need to get vaccinated against meningococcal disease. You may also need additional booster doses.  Varicella immunization.** / Consult your caregiver.  Hepatitis A immunization.** / Consult your caregiver. 2 doses, 6 to 18 months apart.  Hepatitis B immunization.** / Consult your caregiver. 3 doses usually over 6 months. Ages 54 to  15  Blood pressure check.** / Every 1 to 2 years.  Lipid and cholesterol check.** / Every 5 years beginning at age 14.  Clinical breast exam.** / Every year after age 73.  Mammogram.** / Every year beginning at age 62 and continuing for as long as you are in good health. Consult with your caregiver.  Pap test.** / Every 3 years starting at age 30 through age 55 or 17 with a history of 3 consecutive normal Pap tests.  HPV screening.** / Every 3 years from ages 54 through ages 14 to 42 with a history of 3 consecutive normal Pap tests.  Fecal occult blood test (FOBT) of stool. / Every year beginning at age 13 and continuing until age 61. You may not need to do this test if you get a colonoscopy every 10 years.  Flexible sigmoidoscopy or colonoscopy.** / Every 5 years for a flexible sigmoidoscopy or every 10 years for a colonoscopy beginning at age 60 and continuing until age 74.  Hepatitis C blood test.** / For all people born from [birth year] through 1965 and any individual with known risks for hepatitis C.  Skin self-exam. / Monthly.  Influenza immunization.** / Every year.  Pneumococcal polysaccharide immunization.** / 1 to 2 doses if you smoke cigarettes or if you have certain chronic medical conditions.  Tetanus, diphtheria, pertussis (Tdap, Td) immunization.** / A one-time dose of Tdap vaccine. After that, you need a Td booster dose every 10 years.  Measles, mumps, rubella (MMR) immunization. / You need at least 1 dose of MMR if you were born in 1957 or later. You may also need a second dose.  Varicella immunization.** / Consult your caregiver.  Meningococcal immunization.** / Consult your caregiver.  Hepatitis A immunization.** / Consult your caregiver. 2 doses, 6 to 18 months apart.  Hepatitis B immunization.** / Consult your caregiver. 3 doses, usually over 6 months. Ages 71 and over  Blood pressure check.** / Every 1 to 2 years.  Lipid and cholesterol check.** / Every 5 years  beginning at age 62.  Clinical breast exam.** / Every year after age 94.  Mammogram.** / Every year beginning at age 23 and continuing for as long as you are in good health. Consult with your caregiver.  Pap test.** / Every 3 years starting at age 39 through age 65 or 93 with a 3 consecutive normal Pap tests. Testing can be stopped between 65 and 70 with 3 consecutive normal Pap tests and no abnormal Pap or HPV tests in the past 10 years.  HPV screening.** / Every 3 years from ages 29 through ages 21 or 14 with a history of 3 consecutive normal Pap tests. Testing can be stopped between 65 and 70 with 3 consecutive normal Pap tests and no abnormal Pap or HPV tests in the past 10 years.  Fecal occult blood test (FOBT) of stool. / Every year beginning at age 3 and continuing until age 82. You may not need to do this test if you get a colonoscopy every 10 years.  Flexible sigmoidoscopy or colonoscopy.** / Every 5 years for a flexible sigmoidoscopy or every 10 years for a colonoscopy beginning at age 77 and continuing until age 39.  Hepatitis C blood test.** / For all people born from [birth year] through 1966 and any individual with known risks for hepatitis C.  Osteoporosis screening.** / A one-time screening for women  ages 92 and over and women at risk for fractures or osteoporosis.  Skin self-exam. / Monthly.  Influenza immunization.** / Every year.  Pneumococcal polysaccharide immunization.** / 1 dose at age 42 (or older) if you have never been vaccinated.  Tetanus, diphtheria, pertussis (Tdap, Td) immunization. / A one-time dose of Tdap vaccine if you are over 65 and have contact with an infant, are a Research scientist (physical sciences), or simply want to be protected from whooping cough. After that, you need a Td booster dose every 10 years.  Varicella immunization.** / Consult your caregiver.  Meningococcal immunization.** / Consult your caregiver.  Hepatitis A immunization.** / Consult your caregiver. 2  doses, 6 to 18 months apart.  Hepatitis B immunization.** / Check with your caregiver. 3 doses, usually over 6 months. ** Family history and personal history of risk and conditions may change your caregiver's recommendations. Document Released: 03/23/2001 Document Revised: 04/19/2011 Document Reviewed: 06/22/2010 [facility] Patient Information 2014 [place], [place].

## 2012-08-16 DIAGNOSIS — J0100 Acute maxillary sinusitis, unspecified: Secondary | ICD-10-CM | POA: Insufficient documentation

## 2012-08-22 NOTE — Progress Notes (Signed)
Quick Note:  I spoke with pt ______ 

## 2012-08-24 ENCOUNTER — Encounter: Payer: Self-pay | Admitting: Internal Medicine

## 2012-08-25 ENCOUNTER — Encounter: Payer: Self-pay | Admitting: Obstetrics and Gynecology

## 2012-08-25 ENCOUNTER — Ambulatory Visit (INDEPENDENT_AMBULATORY_CARE_PROVIDER_SITE_OTHER): Payer: BC Managed Care – PPO | Admitting: Obstetrics and Gynecology

## 2012-08-25 VITALS — BP 100/60 | HR 76 | Ht 67.0 in | Wt 133.5 lb

## 2012-08-25 DIAGNOSIS — N952 Postmenopausal atrophic vaginitis: Secondary | ICD-10-CM

## 2012-08-25 MED ORDER — ESTROGENS, CONJUGATED 0.625 MG/GM VA CREA: TOPICAL_CREAM | Freq: Every day | VAGINAL | Status: DC

## 2012-08-25 MED ORDER — ESTRADIOL 10 MCG VA TABS: 1.0000 | ORAL_TABLET | VAGINAL | Status: DC

## 2012-08-25 NOTE — Progress Notes (Signed)
Patient ID: Lynn Griffin, female   DOB: [birth year], 57 y.o.   MRN: [id number] Referral - Dr. Darrell Jewel  57 y.o.   Married    Caucasian   female  LMP was 2 and 1/2 years ago G2P2002   here for left perineal lump.  Feels thicker. Noted it 6 months ago.  Saw her PCP, Dr. Darrell Jewel. It was a small area. Diagnosis was a blocked duct.   It appears unchanged over time. Never drains. Not painful.  Felt like a pimple at one point. No ulceration. No history of Bartholin cyst.  Has a history of psoriasis.   History of yeast infections, nothing other than this.   Having vaginal burning with intercourse.  Feels dry. No history of hormonal therapy. Has used some herbal therapy.  Used Black cohosh in the past.    Two vaginal deliveries.  Had episiotomy.  Patient's last menstrual period was 02/08/2010.          Sexually active: yes  The current method of family planning is post menopausal status.    Exercising: walking, weights, biking, yoga Last mammogram:  12/22/12 [facility]. Last pap smear: 07/2011 History of abnormal pap: no Smoking:no Alcohol:  6 beer/wine per week Last colonoscopy:  2006 - normal.  Due in 2016 Last Bone Density:  never Last tetanus shot:  7 years ago? Last cholesterol check:   July 2014 - increased      Family History  Problem Relation Age of Onset  . Anemia Mother   . Parkinsonism Mother 75  . Coronary artery disease Father 39  . Healthy Sister   . Healthy Brother   . Healthy Sister   . Healthy Sister     Patient Active Problem List   Diagnosis Date Noted  . Subacute maxillary sinusitis 08/16/2012  . Acute sinusitis with symptoms greater than 10 days 05/18/2012  . Allergic rhinitis 05/18/2012  . Bump left perineum 03/01/2012  . Visit for preventive health examination 06/03/2011  . Exercise-induced asthma   . Routine gynecological examination 05/31/2011  . ANEMIA 10/30/2008  . PALPITATIONS, OCCASIONAL 10/30/2008  . BREAST MASS, RIGHT  01/25/2008  . HYPERLIPIDEMIA 03/03/2007  . BREAST CYST 03/03/2007  . PERIMENOPAUSAL STATUS 03/03/2007    Past Medical History  Diagnosis Date  . Exercise-induced asthma   . Breast cyst   . Treadmill stress test negative for angina pectoris     neg myoview 2008  . Desensitization to allergy shot     hx of as a child   . Rectal bleeding 07/09/2010    Probably was beet juice      Past Surgical History  Procedure Laterality Date  . Myoview stess      negative 2008  . Childbirth x 2    . Hx allergy shots    . Left knee reconstruction  1989    Allergies: Review of patient's allergies indicates no known allergies.  Current Outpatient Prescriptions  Medication Sig Dispense Refill  . levofloxacin (LEVAQUIN) 750 MG tablet Take 1 tablet (750 mg total) by mouth daily.  7 tablet  0   No current facility-administered medications for this visit.    ROS: Pertinent items are noted in HPI.  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  Exam:    BP 100/60  Pulse 76  Ht 5\' 7"  (1.702 m)  Wt 133 lb 8 oz (60.555 kg)  BMI 20.9 kg/m2  LMP 02/08/2010   Wt Readings from Last 3 Encounters:  08/25/12 133 lb 8 oz (60.555 kg)  08/15/12 132 lb (59.875 kg)  07/24/12 133 lb (60.328 kg)     Ht Readings from Last 3 Encounters:  08/25/12 5\' 7"  (1.702 m)  08/15/12 5' 6.75" (1.695 m)  05/31/11 5\' 7"  (1.702 m)    General appearance: alert, cooperative and appears stated age Head: Normocephalic, without obvious abnormality, atraumatic Neck: no adenopathy, supple, symmetrical, trachea midline and thyroid not enlarged, symmetric, no tenderness/mass/nodules Lungs: clear to auscultation bilaterally Breasts: Inspection negative, No nipple retraction or dimpling, No nipple discharge or bleeding, No axillary or supraclavicular adenopathy, Normal to palpation without dominant masses Heart: regular rate and rhythm Abdomen: soft, non-tender; bowel sounds normal; no masses,  no organomegaly Extremities:  extremities normal, atraumatic, no cyanosis or edema Skin: Skin color, texture, turgor normal. No rashes or lesions Lymph nodes: Cervical, supraclavicular, and axillary nodes normal. No abnormal inguinal nodes palpated Neurologic: Grossly normal   Pelvic: External genitalia:  no lesions              Urethra:  normal appearing urethra with no masses, tenderness or lesions              Bartholins and Skenes: normal                 Vagina: normal appearing vagina with normal color and discharge, no lesions. Evidence of prior left mediolateral episiotomy.  Nontender              Cervix: normal appearance       Bimanual Exam:  Uterus:  uterus is normal size, shape, consistency and nontender                                      Adnexa: normal adnexa in size, nontender and no masses                                      Rectovaginal: Confirms                                      Anus:  normal sphincter tone, no lesions  Assessment  Atrophic vaginitis. Left mediolateral episiotomy.  I believe this is more noticeable due to atrophic symptoms.  Plan  Premarin vaginal cream 1/2 gram per vagina at hs for two weeks, then 1/2 gram per vagina at hs two times per week. Continue with regular gynecologic care and mammograms. Return prn  An After Visit Summary was printed and given to the patient.

## 2012-08-25 NOTE — Patient Instructions (Addendum)
Atrophic Vaginitis Atrophic vaginitis is a problem of low levels of estrogen in women. This problem can happen at any age. It is most common in women who have gone through menopause ("the change").  HOW WILL I KNOW IF I HAVE THIS PROBLEM? You may have:  Trouble with peeing (urinating), such as:  Going to the bathroom often.  A hard time holding your pee until you reach a bathroom.  Leaking pee.  Having pain when you pee.  Itching or a burning feeling.  Vaginal bleeding and spotting.  Pain during sex.  Dryness of the vagina.  A yellow, bad-smelling fluid (discharge) coming from the vagina. HOW WILL MY DOCTOR CHECK FOR THIS PROBLEM?  During your exam, your doctor will likely find the problem.  If there is a vaginal fluid, it may be checked for infection. HOW WILL THIS PROBLEM BE TREATED? Keep the vulvar skin as clean as possible. Moisturizers and lubricants can help with some of the symptoms. Estrogen replacement can help. There are 2 ways to take estrogen:  Systemic estrogen gets estrogen to your whole body. It takes many weeks or months before the symptoms get better.  You take an estrogen pill.  You use a skin patch. This is a patch that you put on your skin.  If you still have your uterus, your doctor may ask you to take a hormone. Talk to your doctor about the right medicine for you.  Estrogen cream.  This puts estrogen only at the part of your body where you apply it. The cream is put into the vagina or put on the vulvar skin. For some women, estrogen cream works faster than pills or the patch. CAN ALL WOMEN WITH THIS PROBLEM USE ESTROGEN? No. Women with certain types of cancer, liver problems, or problems with blood clots should not take estrogen. Your doctor can help you decide the best treatment for your symptoms. Document Released: 07/14/2007 Document Revised: 04/19/2011 Document Reviewed: 07/14/2007 Exit[employer] Patient Information 2014 [employer].  

## 2012-08-30 ENCOUNTER — Telehealth: Payer: Self-pay | Admitting: Obstetrics and Gynecology

## 2012-08-30 NOTE — Telephone Encounter (Signed)
Patient returned phone call. °

## 2012-08-30 NOTE — Telephone Encounter (Signed)
Phone call to patient regarding estrogen therapy.  Has not filled the Premarin yet.  Was very expensive.  I told patient we have a coupon she can pick up at the front desk to reduce the cost of the first prescription.  I reviewed risks and benefits of taking the vaginal estrogen cream - phlebitis, DVT, PE, stroke, MI, breast cancer.  She understands that these risks are very small due to the small dosage used.   She will continue with yearly mammograms.  She understands that there are other alternatives such as nonprescription vitamin E vaginal suppositories and Osphena.

## 2012-08-30 NOTE — Telephone Encounter (Signed)
Call to patient to discuss Premarin vaginal cream.

## 2012-08-30 NOTE — Telephone Encounter (Signed)
No answer.  I left message.  I will need to try again later.

## 2012-11-08 ENCOUNTER — Other Ambulatory Visit: Payer: Self-pay | Admitting: Dermatology

## 2012-12-14 ENCOUNTER — Other Ambulatory Visit: Payer: Self-pay

## 2013-05-16 ENCOUNTER — Telehealth: Payer: Self-pay | Admitting: Internal Medicine

## 2013-05-16 NOTE — Telephone Encounter (Signed)
Patient came in and stated that she went to the dermatologist and they advised her to come to her PCP to request a referral for dermatology. She had a mole removed in September that came back atypical. She wanted to get the site rechecked. She no longer has the same insurance, but she didn't mention what insurance she has or present the insurance card. She wants a call on her cell phone [phone]. Thanks!

## 2013-05-16 NOTE — Telephone Encounter (Signed)
Ok to refer  Make sure we have the  Notes from the dermatologist ( have her sign release from tha visit if not in the record.

## 2013-05-18 NOTE — Telephone Encounter (Signed)
Left a message on the patient's cell for her to return my call.  No machine at the home number.

## 2013-05-18 NOTE — Telephone Encounter (Signed)
Pt returned Misty call @ 2:06pm  Pt phone number  [phone]

## 2013-05-21 ENCOUNTER — Other Ambulatory Visit: Payer: Self-pay | Admitting: Family Medicine

## 2013-05-21 DIAGNOSIS — L570 Actinic keratosis: Secondary | ICD-10-CM

## 2013-05-21 DIAGNOSIS — Z85828 Personal history of other malignant neoplasm of skin: Secondary | ICD-10-CM

## 2013-05-21 DIAGNOSIS — D485 Neoplasm of uncertain behavior of skin: Secondary | ICD-10-CM

## 2013-05-21 NOTE — Telephone Encounter (Signed)
Spoke to the pt.  She gave me dx code to use for referral.  Also instructed her to have notes sent over for [facility]. Order placed in the system.  Pt notified.

## 2013-06-13 ENCOUNTER — Other Ambulatory Visit: Payer: Self-pay | Admitting: Dermatology

## 2013-10-08 ENCOUNTER — Other Ambulatory Visit (INDEPENDENT_AMBULATORY_CARE_PROVIDER_SITE_OTHER): Payer: 59

## 2013-10-08 DIAGNOSIS — Z0000 Encounter for general adult medical examination without abnormal findings: Secondary | ICD-10-CM

## 2013-10-08 LAB — CBC WITH DIFFERENTIAL/PLATELET
BASOS ABS: 0.1 10*3/uL (ref 0.0–0.1)
Basophils Relative: 1.4 % (ref 0.0–3.0)
Eosinophils Absolute: 0.2 10*3/uL (ref 0.0–0.7)
Eosinophils Relative: 4.4 % (ref 0.0–5.0)
HCT: 37.9 % (ref 36.0–46.0)
Hemoglobin: 12.8 g/dL (ref 12.0–15.0)
LYMPHS ABS: 1.4 10*3/uL (ref 0.7–4.0)
Lymphocytes Relative: 29.9 % (ref 12.0–46.0)
MCHC: 33.9 g/dL (ref 30.0–36.0)
MCV: 93.9 fl (ref 78.0–100.0)
MONOS PCT: 7.1 % (ref 3.0–12.0)
Monocytes Absolute: 0.3 10*3/uL (ref 0.1–1.0)
Neutro Abs: 2.6 10*3/uL (ref 1.4–7.7)
Neutrophils Relative %: 57.2 % (ref 43.0–77.0)
PLATELETS: 249 10*3/uL (ref 150.0–400.0)
RBC: 4.04 Mil/uL (ref 3.87–5.11)
RDW: 13.4 % (ref 11.5–15.5)
WBC: 4.6 10*3/uL (ref 4.0–10.5)

## 2013-10-08 LAB — LIPID PANEL
Cholesterol: 246 mg/dL — ABNORMAL HIGH (ref 0–200)
HDL: 68.6 mg/dL (ref >39.00)
LDL Cholesterol: 164 mg/dL — ABNORMAL HIGH (ref 0–99)
NonHDL: 177.4
TRIGLYCERIDES: 69 mg/dL (ref 0.0–149.0)
Total CHOL/HDL Ratio: 4
VLDL: 13.8 mg/dL (ref 0.0–40.0)

## 2013-10-08 LAB — BASIC METABOLIC PANEL
BUN: 14 mg/dL (ref 6–23)
CALCIUM: 9.3 mg/dL (ref 8.4–10.5)
CO2: 26 mEq/L (ref 19–32)
Chloride: 106 mEq/L (ref 96–112)
Creatinine, Ser: 1 mg/dL (ref 0.4–1.2)
GFR: 60.3 mL/min (ref >60.00)
GLUCOSE: 72 mg/dL (ref 70–99)
Potassium: 4.1 mEq/L (ref 3.5–5.1)
Sodium: 140 mEq/L (ref 135–145)

## 2013-10-08 LAB — HEPATIC FUNCTION PANEL
ALT: 18 U/L (ref 0–35)
AST: 25 U/L (ref 0–37)
Albumin: 3.9 g/dL (ref 3.5–5.2)
Alkaline Phosphatase: 48 U/L (ref 39–117)
BILIRUBIN TOTAL: 0.7 mg/dL (ref 0.2–1.2)
Bilirubin, Direct: 0 mg/dL (ref 0.0–0.3)
Total Protein: 6.9 g/dL (ref 6.0–8.3)

## 2013-10-08 LAB — TSH: TSH: 1.57 u[IU]/mL (ref 0.35–4.50)

## 2013-10-09 ENCOUNTER — Other Ambulatory Visit: Payer: BC Managed Care – PPO

## 2013-10-23 ENCOUNTER — Ambulatory Visit (INDEPENDENT_AMBULATORY_CARE_PROVIDER_SITE_OTHER): Payer: 59 | Admitting: Internal Medicine

## 2013-10-23 ENCOUNTER — Encounter: Payer: Self-pay | Admitting: Internal Medicine

## 2013-10-23 VITALS — BP 114/76 | Temp 98.1°F | Ht 66.75 in | Wt 129.0 lb

## 2013-10-23 DIAGNOSIS — Z23 Encounter for immunization: Secondary | ICD-10-CM

## 2013-10-23 DIAGNOSIS — Z0000 Encounter for general adult medical examination without abnormal findings: Secondary | ICD-10-CM

## 2013-10-23 DIAGNOSIS — E782 Mixed hyperlipidemia: Secondary | ICD-10-CM

## 2013-10-23 NOTE — Progress Notes (Signed)
Pre visit review using our clinic review tool, if applicable. No additional management support is needed unless otherwise documented below in the visit note.  Chief Complaint  Patient presents with  . Annual Exam    HPI: Patient comes in today for Preventive Health Care visit  No major change in health status since last visit . The vagina areawas  Bump scar tissue and not significant  No  Sees derm about once a year  Had to get referral alst time  Hx of atypica some lesions removed no cancer  Blood tests  Not eating as well at some time. Recently but usually eats well   Health Maintenance  Topic Date Due  . Mammogram  02/22/2014  . Pap Smear  05/31/2014  . Influenza Vaccine  09/09/2014  . Tetanus/tdap  06/23/2015  . Colonoscopy  10/29/2015   Health Maintenance Review LIFESTYLE:  Exercise:  Yoga 1-2 x per week and weight  2-4 per week Tobacco/ETS: no Alcohol: 6 per week  Sugar beverages: not regularly  Sleep: sleep chopped up about 6-7 hours  Drug use: no mammo last 2014  Colonoscopy: ok Taking ca vit d tid    ROS:  Bump from episiotomy  GEN/ HEENT: No fever, significant weight changes sweats headaches vision problems hearing changes, CV/ PULM; No chest pain shortness of breath cough, syncope,edema  change in exercise tolerance. GI /GU: No adominal pain, vomiting, change in bowel habits. No blood in the stool. No significant GU symptoms. SKIN/HEME: ,no acute skin rashes suspicious lesions or bleeding. No lymphadenopathy, nodules, masses.  NEURO/ PSYCH:  No neurologic signs such as weakness numbness. No depression anxiety. IMM/ Allergy: No unusual infections.  Allergy .   REST of 12 system review negative except as per HPI  Past Medical History  Diagnosis Date  . Exercise-induced asthma   . Breast cyst   . Treadmill stress test negative for angina pectoris     neg myoview 2008  . Desensitization to allergy shot     hx of as a child   . Rectal bleeding 07/09/2010   Probably was beet juice      Family History  Problem Relation Age of Onset  . Anemia Mother   . Parkinsonism Mother 9  . Coronary artery disease Father 63  . Healthy Sister   . Healthy Brother   . Healthy Sister   . Healthy Sister     History   Social History  . Marital Status: Married    Spouse Name: N/A    Number of Children: N/A  . Years of Education: N/A   Social History Main Topics  . Smoking status: Former Research scientist (life sciences)  . Smokeless tobacco: Never Used  . Alcohol Use: Yes     Comment: occasionally   . Drug Use: No  . Sexual Activity: No   Other Topics Concern  . None   Social History Narrative   Married   Former smoker   Regular Exercise- yes   hh of 2     1 cats   Child biirth x 2           Outpatient Encounter Prescriptions as of 10/23/2013  Medication Sig  . Calcium-Magnesium-Vitamin D 600-40-500 MG-MG-UNIT TB24 Take 1 tablet by mouth daily.  Marland Kitchen estradiol (ESTRACE) 0.1 MG/GM vaginal cream 1 Applicatorful daily.  . Multiple Vitamins-Minerals (MULTIVITAMIN PO) Take 1 tablet by mouth daily.  Marland Kitchen OVER THE COUNTER MEDICATION HERBAL BOWEL STIMULANT  . [DISCONTINUED] conjugated estrogens (PREMARIN) vaginal cream Place vaginally  daily. Use 1/2 g vaginally every night at bed time for the first 2 weeks, then use 1/2 g vaginally two or three times per week as needed to maintain symptom relief.  . [DISCONTINUED] levofloxacin (LEVAQUIN) 750 MG tablet Take 1 tablet (750 mg total) by mouth daily.    EXAM:  BP 114/76  Temp(Src) 98.1 F (36.7 C) (Oral)  Ht 5' 6.75" (1.695 m)  Wt 129 lb (58.514 kg)  BMI 20.37 kg/m2  LMP 02/08/2010  Body mass index is 20.37 kg/(m^2).  Physical Exam: Vital signs reviewed BZJ:IRCV is a well-developed well-nourished alert cooperative    who appearsr stated age in no acute distress.  HEENT: normocephalic atraumatic , Eyes: PERRL EOM's full, conjunctiva clear, Nares: paten,t no deformity discharge or tenderness., Ears: no deformity EAC's  clear TMs with normal landmarks. Mouth: clear OP, no lesions, edema.  Moist mucous membranes. Dentition in adequate repair. NECK: supple without masses, thyromegaly or bruits. CHEST/PULM:  Clear to auscultation and percussion breath sounds equal no wheeze , rales or rhonchi. No chest wall deformities or tenderness. Breast: normal by inspection . No dimpling, discharge, masses, tenderness or discharge . CV: PMI is nondisplaced, S1 S2 no gallops, murmurs, rubs. Peripheral pulses are full without delay.No JVD .  ABDOMEN: Bowel sounds normal nontender  No guard or rebound, no hepato splenomegal no CVA tenderness.  No hernia. Extremtities:  No clubbing cyanosis or edema, no acute joint swelling or redness no focal atrophy NEURO:  Oriented x3, cranial nerves 3-12 appear to be intact, no obvious focal weakness,gait within normal limits no abnormal reflexes or asymmetrical SKIN: No acute rashes normal turgor, color, no bruising or petechiae.sun changes  PSYCH: Oriented, good eye contact, no obvious depression anxiety, cognition and judgment appear normal. LN: no cervical axillary inguinal adenopathy  Lab Results  Component Value Date   WBC 4.6 10/08/2013   HGB 12.8 10/08/2013   HCT 37.9 10/08/2013   PLT 249.0 10/08/2013   GLUCOSE 72 10/08/2013   CHOL 246* 10/08/2013   TRIG 69.0 10/08/2013   HDL 68.60 10/08/2013   LDLDIRECT 185.9 08/15/2012   LDLCALC 164* 10/08/2013   ALT 18 10/08/2013   AST 25 10/08/2013   NA 140 10/08/2013   K 4.1 10/08/2013   CL 106 10/08/2013   CREATININE 1.0 10/08/2013   BUN 14 10/08/2013   CO2 26 10/08/2013   TSH 1.57 10/08/2013    ASSESSMENT AND PLAN:  Discussed the following assessment and plan:  Visit for preventive health examination - disc screening  due for mammo risk benefit  HYPERLIPIDEMIA - advise lsi   Need for prophylactic vaccination and inoculation against influenza - Plan: Flu Vaccine QUAD 36+ mos PF IM (Fluarix Quad PF) Contact us  f need another derm  referral Patient Care Team: Burnis Medin, MD as PCP - General Jari Pigg, MD as Attending Physician (Dermatology) Janne Lab, MD (Obstetrics and Gynecology) Patient Instructions  Continue lifestyle intervention healthy eating and exercise . Healthy lifestyle includes : At least 150 minutes of exercise weeks  , weight at healthy levels, which is usually   BMI 19-25. Avoid trans fats and processed foods;  Increase fresh fruits and veges to 5 servings per day. And avoid sweet beverages including tea and juice. Mediterranean diet with olive oil and nuts have been noted to be heart and brain healthy . Avoid tobacco products . Limit  alcohol to  7 per week for women and 14 servings for men.  Get adequate sleep . Wear seat  belts . Don't text and drive .   Mammogram  Soon     Standley Brooking. Claudius Mich M.D.

## 2013-10-23 NOTE — Patient Instructions (Signed)
Continue lifestyle intervention healthy eating and exercise . Healthy lifestyle includes : At least 150 minutes of exercise weeks  , weight at healthy levels, which is usually   BMI 19-25. Avoid trans fats and processed foods;  Increase fresh fruits and veges to 5 servings per day. And avoid sweet beverages including tea and juice. Mediterranean diet with olive oil and nuts have been noted to be heart and brain healthy . Avoid tobacco products . Limit  alcohol to  7 per week for women and 14 servings for men.  Get adequate sleep . Wear seat belts . Don't text and drive .   Mammogram  Soon

## 2013-12-10 ENCOUNTER — Encounter: Payer: Self-pay | Admitting: Internal Medicine

## 2014-01-07 ENCOUNTER — Other Ambulatory Visit: Payer: Self-pay

## 2014-01-07 DIAGNOSIS — Z1231 Encounter for screening mammogram for malignant neoplasm of breast: Secondary | ICD-10-CM

## 2014-02-27 ENCOUNTER — Ambulatory Visit
Admission: RE | Admit: 2014-02-27 | Discharge: 2014-02-27 | Disposition: A | Discharge status: Home / Self Care | Payer: 59 | Source: Ambulatory Visit

## 2014-02-27 DIAGNOSIS — Z1231 Encounter for screening mammogram for malignant neoplasm of breast: Secondary | ICD-10-CM

## 2014-03-15 ENCOUNTER — Encounter: Payer: Self-pay | Admitting: Internal Medicine

## 2014-03-15 ENCOUNTER — Ambulatory Visit (INDEPENDENT_AMBULATORY_CARE_PROVIDER_SITE_OTHER): Payer: 59 | Admitting: Internal Medicine

## 2014-03-15 VITALS — BP 124/74 | Temp 98.2°F | Ht 66.75 in | Wt 128.5 lb

## 2014-03-15 DIAGNOSIS — R220 Localized swelling, mass and lump, head: Secondary | ICD-10-CM

## 2014-03-15 DIAGNOSIS — L989 Disorder of the skin and subcutaneous tissue, unspecified: Secondary | ICD-10-CM

## 2014-03-15 DIAGNOSIS — Z872 Personal history of diseases of the skin and subcutaneous tissue: Secondary | ICD-10-CM

## 2014-03-15 NOTE — Patient Instructions (Signed)
I agree with seeing  Dr Delman Cheadle about the lesion   On the nose area . Also  For  Our yearly  Skin check  High risk .

## 2014-03-15 NOTE — Progress Notes (Signed)
Pre visit review using our clinic review tool, if applicable. No additional management support is needed unless otherwise documented below in the visit note.  Chief Complaint  Patient presents with  . Facial Swelling    Swelling on the end of her nose.    HPI: Patient Lynn Griffin  comes in today for SDA for  new problem evaluation.  Has had a bump left nose for a while  And sees derm for surveillance and removal of abnormal lesions  Dr Delman Cheadle.   recnetlyly increase redness and swelling   Has used some old retinopid on  Face  X 6-8 x but  No other area of face red and swollen . Called to make appt dr Darnell Level but need referral first bu insuance  Protochol.  No hx of roasacea or other  alos needs referral for surveillance  appt  ROS: See pertinent positives and negatives per HPI.  Past Medical History  Diagnosis Date  . Exercise-induced asthma   . Breast cyst   . Treadmill stress test negative for angina pectoris     neg myoview 2008  . Desensitization to allergy shot     hx of as a child   . Rectal bleeding 07/09/2010    Probably was beet juice      Family History  Problem Relation Age of Onset  . Anemia Mother   . Parkinsonism Mother 6  . Coronary artery disease Father 66  . Healthy Sister   . Healthy Brother   . Healthy Sister   . Healthy Sister     History   Social History  . Marital Status: Married    Spouse Name: N/A    Number of Children: N/A  . Years of Education: N/A   Social History Main Topics  . Smoking status: Former Research scientist (life sciences)  . Smokeless tobacco: Never Used  . Alcohol Use: Yes     Comment: occasionally   . Drug Use: No  . Sexual Activity: No   Other Topics Concern  . Not on file   Social History Narrative   Married   Former smoker   Regular Exercise- yes   hh of 2     1 cats   Child biirth x 2           Outpatient Encounter Prescriptions as of 03/15/2014  Medication Sig  . Calcium-Magnesium-Vitamin D 600-40-500 MG-MG-UNIT TB24 Take 1  tablet by mouth daily.  Marland Kitchen estradiol (ESTRACE) 0.1 MG/GM vaginal cream 1 Applicatorful daily.  . Multiple Vitamins-Minerals (MULTIVITAMIN PO) Take 1 tablet by mouth daily.  Marland Kitchen OVER THE COUNTER MEDICATION HERBAL BOWEL STIMULANT    EXAM:  BP 124/74 mmHg  Temp(Src) 98.2 F (36.8 C) (Oral)  Ht 5' 6.75" (1.695 m)  Wt 128 lb 8 oz (58.287 kg)  BMI 20.29 kg/m2  LMP 02/08/2010  Body mass index is 20.29 kg/(m^2).  GENERAL: vitals reviewed and listed above, alert, oriented, appears well hydrated and in no acute distress HEENT: atraumatic, conjunctiva  clear,   Mild facila erythema  Left nasal area with small bump pink and edema surrounding this without fluctuance or  Vesicle  No streaking  No sig tenderness NECK: no obvious masses on inspection palpation    ASSESSMENT AND PLAN:  Discussed the following assessment and plan:  Facial skin lesion - left nose pink irreg and swelling aroung ? if could be from topical but worse recently over weeks needs referral before can be seen by derm.  - Plan: Ambulatory referral  to Dermatology  Swelling of nose - could be from  topical or lesion see derm no obv infection? rosacea - Plan: Ambulatory referral to Dermatology  Hx of actinic keratosis - Plan: Ambulatory referral to Dermatology Contact us if  havent heard in a week about appt.  -Patient advised to return or notify health care team  if symptoms worsen ,persist or new concerns arise.  Patient Instructions  I agree with seeing  Dr Delman Cheadle about the lesion   On the nose area . Also  For  Our yearly  Skin check  High risk .      Standley Brooking. [name] M.D.

## 2014-05-08 ENCOUNTER — Encounter: Payer: Self-pay | Admitting: Internal Medicine

## 2014-05-08 ENCOUNTER — Ambulatory Visit (INDEPENDENT_AMBULATORY_CARE_PROVIDER_SITE_OTHER): Payer: 59 | Admitting: Internal Medicine

## 2014-05-08 VITALS — BP 112/80 | Temp 98.2°F | Ht 66.75 in | Wt 132.0 lb

## 2014-05-08 DIAGNOSIS — L237 Allergic contact dermatitis due to plants, except food: Secondary | ICD-10-CM | POA: Diagnosis not present

## 2014-05-08 MED ORDER — TRIAMCINOLONE ACETONIDE 0.025 % EX CREA: 1.0000 "application " | TOPICAL_CREAM | Freq: Two times a day (BID) | CUTANEOUS | Status: DC

## 2014-05-08 MED ORDER — PREDNISONE 10 MG PO TABS: ORAL_TABLET | ORAL | Status: DC

## 2014-05-08 NOTE — Progress Notes (Signed)
Pre visit review using our clinic review tool, if applicable. No additional management support is needed unless otherwise documented below in the visit note. 

## 2014-05-08 NOTE — Patient Instructions (Signed)
Patient advised to call office if rash persists or worsens.

## 2014-05-08 NOTE — Assessment & Plan Note (Signed)
59 year old white female has mild poison ivy dermatitis on left cheek. Use low potency triamcinolone cream twice daily for 7-10 days. Patient planning to travel to go skiing within 1 week. She is worried that her rash might worsen. Prescription for prednisone taper provided. Unlikely patient will need prednisone.

## 2014-05-08 NOTE — Progress Notes (Signed)
   Subjective:    Patient ID: Lynn Griffin, female    DOB: 03-15-1955, 59 y.o.   MRN: [id number]  HPI  59 year old white female presents with facial rash. Patient reports she was working in her garden over the past few days. Her symptoms started on Monday. She has had frequent poison ivy in the past. Rash mainly limited to left cheek and left lower face. She has small spots on right anterior shoulder and hands. Mild pruritus.  Review of Systems Mild pruritus    Past Medical History  Diagnosis Date  . Exercise-induced asthma   . Breast cyst   . Treadmill stress test negative for angina pectoris     neg myoview 2008  . Desensitization to allergy shot     hx of as a child   . Rectal bleeding 07/09/2010    Probably was beet juice      History   Social History  . Marital Status: Married    Spouse Name: N/A  . Number of Children: N/A  . Years of Education: N/A   Occupational History  . Not on file.   Social History Main Topics  . Smoking status: Former Research scientist (life sciences)  . Smokeless tobacco: Never Used  . Alcohol Use: Yes     Comment: occasionally   . Drug Use: No  . Sexual Activity: No   Other Topics Concern  . Not on file   Social History Narrative   Married   Former smoker   Regular Exercise- yes   hh of 2     1 cats   Child biirth x 2           Past Surgical History  Procedure Laterality Date  . Myoview stess      negative 2008  . Childbirth x 2    . Hx allergy shots    . Left knee reconstruction  1989    Family History  Problem Relation Age of Onset  . Anemia Mother   . Parkinsonism Mother 21  . Coronary artery disease Father 72  . Healthy Sister   . Healthy Brother   . Healthy Sister   . Healthy Sister     No Known Allergies  Current Outpatient Prescriptions on File Prior to Visit  Medication Sig Dispense Refill  . Calcium-Magnesium-Vitamin D 600-40-500 MG-MG-UNIT TB24 Take 1 tablet by mouth daily.    Marland Kitchen estradiol (ESTRACE) 0.1 MG/GM vaginal cream 1  Applicatorful daily.    . Multiple Vitamins-Minerals (MULTIVITAMIN PO) Take 1 tablet by mouth daily.    Marland Kitchen OVER THE COUNTER MEDICATION HERBAL BOWEL STIMULANT     No current facility-administered medications on file prior to visit.    BP 112/80 mmHg  Temp(Src) 98.2 F (36.8 C) (Oral)  Ht 5' 6.75" (1.695 m)  Wt 132 lb (59.875 kg)  BMI 20.84 kg/m2  LMP 02/08/2010    Objective:   Physical Exam  Constitutional: She appears well-developed and well-nourished.  Cardiovascular: Normal rate and normal heart sounds.   Pulmonary/Chest: Effort normal and breath sounds normal.  Skin:  Small area of erythema covering left cheek and left lower jaw.            Assessment & Plan:

## 2014-08-06 ENCOUNTER — Telehealth: Payer: Self-pay | Admitting: Internal Medicine

## 2014-08-06 MED ORDER — CIPROFLOXACIN HCL 500 MG PO TABS: 500.0000 mg | ORAL_TABLET | Freq: Two times a day (BID) | ORAL | Status: DC

## 2014-08-06 NOTE — Telephone Encounter (Signed)
Spoke to the pt.  She has decided to only accept the cipro at this time.  She will call back to make an appointment if she changes her mind and wants the altitude sickness medication.

## 2014-08-06 NOTE — Addendum Note (Signed)
Addended by: Miles Costain T on: 08/06/2014 05:05 PM   Modules accepted: Orders

## 2014-08-06 NOTE — Telephone Encounter (Signed)
Usually only use   Altitude sickens's rx prevention vs treatment based  On a number of factors and on a case by case basis .including latitude of based camp and rapidity of ascent etc .   Can rx cipro 500 1 po bid disp 6 if needed for traveler diarrhea  She can make  OV  If she really thinks needs other meds and get [place] more information   .

## 2014-08-06 NOTE — Telephone Encounter (Signed)
LM on listed number for the pt to return my call.

## 2014-08-06 NOTE — Telephone Encounter (Signed)
Pt said she is going to [place] on 08/15/14 and was reading that you should take something with you for GI . Pt said   CIPRO and a altitude sickness pill    Pharmacy ; [place]

## 2014-09-04 ENCOUNTER — Ambulatory Visit (INDEPENDENT_AMBULATORY_CARE_PROVIDER_SITE_OTHER): Payer: 59 | Admitting: Internal Medicine

## 2014-09-04 ENCOUNTER — Encounter: Payer: Self-pay | Admitting: Internal Medicine

## 2014-09-04 VITALS — BP 124/76 | Temp 98.2°F | Wt 128.6 lb

## 2014-09-04 DIAGNOSIS — L089 Local infection of the skin and subcutaneous tissue, unspecified: Secondary | ICD-10-CM | POA: Diagnosis not present

## 2014-09-04 DIAGNOSIS — T148 Other injury of unspecified body region: Secondary | ICD-10-CM

## 2014-09-04 DIAGNOSIS — Z23 Encounter for immunization: Secondary | ICD-10-CM

## 2014-09-04 DIAGNOSIS — T148XXA Other injury of unspecified body region, initial encounter: Principal | ICD-10-CM

## 2014-09-04 MED ORDER — DOXYCYCLINE HYCLATE 100 MG PO TABS: 100.0000 mg | ORAL_TABLET | Freq: Two times a day (BID) | ORAL | Status: DC
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
## 2014-09-04 NOTE — Patient Instructions (Addendum)
Topical antibiotic  1-2 x per day Keep covered telfa and wrap.  Gentle cleansing  Not peroxide . In the wound. Otherwise keep dry.  Antibiotic oral sent in .  Will take a few weeks to heal  Up on its own.  Recheck if  Not healing or concerns .

## 2014-09-06 ENCOUNTER — Telehealth: Payer: Self-pay | Admitting: Internal Medicine

## 2014-09-06 NOTE — Telephone Encounter (Signed)
Noted  

## 2014-09-06 NOTE — Telephone Encounter (Signed)
[facility] - [facility]  Patient Name: Lynn Griffin  DOB: [birth year]    Initial Comment Caller states she was seen for a cut on her leg and given a Rx. She now has a headache.   Nurse Assessment  Nurse: Germain Osgood, RN, Opal Sidles Date/Time Eilene Ghazi Time): 09/06/2014 4:00:50 PM  Confirm and document reason for call. If symptomatic, describe symptoms. ---Caller reports she has a headache all over head mostly toward the front, Has had headache off and on last few days. Is taking Doxycycline . Headache worse after a walk, had to lie down and is now getting better States is more like a feeling in her head. 4/10 now.  Has the patient traveled out of the country within the last 30 days? ---Yes  Where have you traveled? ([place] for [place] and Ebola guideline, [place], [place] for [place]) ---[place]  Does the patient require triage? ---Yes  Related visit to physician within the last 2 weeks? ---Yes  Does the PT have any chronic conditions? (i.e. diabetes, asthma, etc.) ---No     Guidelines    Guideline Title Affirmed Question Affirmed Notes  Headache [1] MODERATE headache (e.g., interferes with normal activities) AND [2] present > 24 hours AND [3] unexplained (Exceptions: analgesics not tried, typical migraine, or headache part of viral illness)    Final Disposition User   See Physician within 24 Hours Germain Osgood, RN, Opal Sidles    Comments  After agreeing with outcome, caller then stated she doesn't think she needs appointment. No urgent care provided. Advised nurse available 24 hours a day 7 days a week. Advised office will be notified   Disagree/Comply: Comply

## 2014-09-06 NOTE — Telephone Encounter (Signed)
FYI

## 2015-09-01 ENCOUNTER — Encounter: Payer: Self-pay | Admitting: Gastroenterology

## 2015-10-03 ENCOUNTER — Other Ambulatory Visit (INDEPENDENT_AMBULATORY_CARE_PROVIDER_SITE_OTHER): Payer: BLUE CROSS/BLUE SHIELD

## 2015-10-03 DIAGNOSIS — Z0000 Encounter for general adult medical examination without abnormal findings: Secondary | ICD-10-CM | POA: Diagnosis not present

## 2015-10-03 LAB — HEPATIC FUNCTION PANEL
ALK PHOS: 59 U/L (ref 39–117)
ALT: 18 U/L (ref 0–35)
AST: 22 U/L (ref 0–37)
Albumin: 4.4 g/dL (ref 3.5–5.2)
BILIRUBIN DIRECT: 0 mg/dL (ref 0.0–0.3)
BILIRUBIN TOTAL: 0.4 mg/dL (ref 0.2–1.2)
TOTAL PROTEIN: 7 g/dL (ref 6.0–8.3)

## 2015-10-03 LAB — BASIC METABOLIC PANEL
BUN: 21 mg/dL (ref 6–23)
CALCIUM: 9.1 mg/dL (ref 8.4–10.5)
CHLORIDE: 105 meq/L (ref 96–112)
CO2: 28 mEq/L (ref 19–32)
CREATININE: 1.09 mg/dL (ref 0.40–1.20)
GFR: 54.23 mL/min — AB (ref >60.00)
Glucose, Bld: 89 mg/dL (ref 70–99)
Potassium: 4.6 mEq/L (ref 3.5–5.1)
Sodium: 138 mEq/L (ref 135–145)

## 2015-10-03 LAB — CBC WITH DIFFERENTIAL/PLATELET
BASOS ABS: 0.1 10*3/uL (ref 0.0–0.1)
Basophils Relative: 1 % (ref 0.0–3.0)
EOS ABS: 0.2 10*3/uL (ref 0.0–0.7)
Eosinophils Relative: 3.4 % (ref 0.0–5.0)
HEMATOCRIT: 39.3 % (ref 36.0–46.0)
HEMOGLOBIN: 13.4 g/dL (ref 12.0–15.0)
LYMPHS PCT: 28.6 % (ref 12.0–46.0)
Lymphs Abs: 1.5 10*3/uL (ref 0.7–4.0)
MCHC: 34.1 g/dL (ref 30.0–36.0)
MCV: 92.7 fl (ref 78.0–100.0)
MONO ABS: 0.3 10*3/uL (ref 0.1–1.0)
Monocytes Relative: 5.7 % (ref 3.0–12.0)
Neutro Abs: 3.2 10*3/uL (ref 1.4–7.7)
Neutrophils Relative %: 61.3 % (ref 43.0–77.0)
Platelets: 293 10*3/uL (ref 150.0–400.0)
RBC: 4.24 Mil/uL (ref 3.87–5.11)
RDW: 13.1 % (ref 11.5–15.5)
WBC: 5.3 10*3/uL (ref 4.0–10.5)

## 2015-10-03 LAB — LIPID PANEL
CHOL/HDL RATIO: 3
Cholesterol: 254 mg/dL — ABNORMAL HIGH (ref 0–200)
HDL: 82.6 mg/dL (ref >39.00)
LDL CALC: 160 mg/dL — AB (ref 0–99)
NONHDL: 171.12
Triglycerides: 54 mg/dL (ref 0.0–149.0)
VLDL: 10.8 mg/dL (ref 0.0–40.0)

## 2015-10-03 LAB — TSH: TSH: 2.98 u[IU]/mL (ref 0.35–4.50)

## 2015-10-08 ENCOUNTER — Encounter: Payer: Self-pay | Admitting: Internal Medicine

## 2015-10-14 NOTE — Progress Notes (Signed)
Pre visit review using our clinic review tool, if applicable. No additional management support is needed unless otherwise documented below in the visit note.  Chief Complaint  Patient presents with  . Annual Exam    HPI: Patient  Lynn Griffin  60 y.o. comes in today for Preventive Health Care visit   Last pap over 2 years ago .  Due  No problem  Off all meds   Health Maintenance  Topic Date Due  . Hepatitis C Screening  03[birth year]  . HIV Screening  09/16/1969  . PAP SMEAR  05/31/2014  . ZOSTAVAX  09/17/2014  . COLONOSCOPY  10/29/2015  . MAMMOGRAM  02/28/2016  . TETANUS/TDAP  09/03/2024  . INFLUENZA VACCINE  Completed   Health Maintenance Review LIFESTYLE:  Exercise:    gym biking yoga  Tobacco/ETS: no Alcohol:  Beer  5-6 per week  Sugar beverages: no  Sleep: 6-8  Drug use: no HH of 2  Menopause age 55     ROS:  GEN/ HEENT: No fever, significant weight changes sweats headaches vision problems hearing changes, CV/ PULM; No chest pain shortness of breath cough, syncope,edema  change in exercise tolerance. GI /GU: No adominal pain, vomiting, change in bowel habits. No blood in the stool. No significant GU symptoms. SKIN/HEME: ,no acute skin rashes suspicious lesions or bleeding. No lymphadenopathy, nodules, masses.  NEURO/ PSYCH:  No neurologic signs such as weakness numbness. No depression anxiety. IMM/ Allergy: No unusual infections.  Allergy .   REST of 12 system review negative except as per HPI   Past Medical History:  Diagnosis Date  . Breast cyst   . Desensitization to allergy shot    hx of as a child   . Exercise-induced asthma   . Rectal bleeding 07/09/2010   Probably was beet juice    . Treadmill stress test negative for angina pectoris    neg myoview 2008    Past Surgical History:  Procedure Laterality Date  . childbirth x 2    . hx allergy shots    . left knee reconstruction  1989  . myoview stess     negative 2008    Family History    Problem Relation Age of Onset  . Anemia Mother   . Parkinsonism Mother 44  . Coronary artery disease Father 3  . Healthy Sister   . Healthy Brother   . Healthy Sister   . Healthy Sister     Social History   Social History  . Marital status: Married    Spouse name: N/A  . Number of children: N/A  . Years of education: N/A   Social History Main Topics  . Smoking status: Former Research scientist (life sciences)  . Smokeless tobacco: Never Used  . Alcohol use Yes     Comment: occasionally   . Drug use: No  . Sexual activity: No   Other Topics Concern  . None   Social History Narrative   Married   Former smoker   Regular Exercise- yes   hh of 2     1 cats   Child biirth x 2           Outpatient Medications Prior to Visit  Medication Sig Dispense Refill  . Multiple Vitamins-Minerals (MULTIVITAMIN PO) Take 1 tablet by mouth daily.    Marland Kitchen OVER THE COUNTER MEDICATION HERBAL BOWEL STIMULANT    . Calcium-Magnesium-Vitamin D 600-40-500 MG-MG-UNIT TB24 Take 1 tablet by mouth daily.    Marland Kitchen doxycycline (VIBRA-TABS) 100 MG  tablet Take 1 tablet (100 mg total) by mouth 2 (two) times daily. 14 tablet 0  . estradiol (ESTRACE) 0.1 MG/GM vaginal cream 1 Applicatorful daily.     No facility-administered medications prior to visit.      EXAM:  BP 106/66 (BP Location: Right Arm, Patient Position: Sitting, Cuff Size: Normal)   Temp 98.3 F (36.8 C) (Oral)   Ht 5' 6.5" (1.689 m)   Wt 125 lb (56.7 kg)   LMP 02/08/2010   BMI 19.87 kg/m   Body mass index is 19.87 kg/m.  Physical Exam: Vital signs reviewed NAT:FTDD is a well-developed well-nourished alert cooperative    who appearsr stated age in no acute distress.  HEENT: normocephalic atraumatic , Eyes: PERRL EOM's full, conjunctiva clear, Nares: paten,t no deformity discharge or tenderness., Ears: no deformity EAC's clear TMs with normal landmarks. Mouth: clear OP, no lesions, edema.  Moist mucous membranes. Dentition in adequate repair. NECK: supple  without masses, thyromegaly or bruits. CHEST/PULM:  Clear to auscultation and percussion breath sounds equal no wheeze , rales or rhonchi. No chest wall deformities or tenderness.Breast: normal by inspection . No dimpling, discharge, masses, tenderness or discharge . CV: PMI is nondisplaced, S1 S2 no gallops, murmurs, rubs. Peripheral pulses are full without delay.No JVD .  ABDOMEN: Bowel sounds normal nontender  No guard or rebound, no hepato splenomegal no CVA tenderness.  No hernia. Extremtities:  No clubbing cyanosis or edema, no acute joint swelling or redness no focal atrophy NEURO:  Oriented x3, cranial nerves 3-12 appear to be intact, no obvious focal weakness,gait within normal limits no abnormal reflexes or asymmetrical SKIN: No acute rashes normal turgor, color, no bruising or petechiae. Sun changes  PSYCH: Oriented, good eye contact, no obvious depression anxiety, cognition and judgment appear normal. LN: no cervical axillary inguinal adenopathy Pelvic: NL ext GU, labia clear without lesions or rash . Vagina no lesions .Cervix: clear  UTERUS: Neg CMT Adnexa:  clear no masses . PAP done Rectal no mass stool heme negative   Lab Results  Component Value Date   WBC 5.3 10/03/2015   HGB 13.4 10/03/2015   HCT 39.3 10/03/2015   PLT 293.0 10/03/2015   GLUCOSE 89 10/03/2015   CHOL 254 (H) 10/03/2015   TRIG 54.0 10/03/2015   HDL 82.60 10/03/2015   LDLDIRECT 185.9 08/15/2012   LDLCALC 160 (H) 10/03/2015   ALT 18 10/03/2015   AST 22 10/03/2015   NA 138 10/03/2015   K 4.6 10/03/2015   CL 105 10/03/2015   CREATININE 1.09 10/03/2015   BUN 21 10/03/2015   CO2 28 10/03/2015   TSH 2.98 10/03/2015    ASSESSMENT AND PLAN:  Discussed the following assessment and plan:  Visit for preventive health examination - Plan: PAP [[place]]  Encounter for routine gynecological examination - Plan: PAP [[place]]  HYPERLIPIDEMIA - lsi  hihg hdl ratio 3   Need for immunization against  influenza - Plan: Flu Vaccine QUAD 36+ mos IM  Colon cancer screening - says solonscopy due too expensive at this time  ave risk ok to do ifob yearly in the interim until affordable - Plan: IFOBT POC (occult bld, rslt in office)  Need for hepatitis C screening test - Plan: Hepatitis C antibody  Screening for HIV (human immunodeficiency virus) - Plan: HIV antibody  Decreased GFR x 1  - borderline may not be sig  recheck cr and do ur prot cr ratio screen at next lab in a month avoid nsaids  neg fam hx - Plan: Protein / creatinine ratio, urine, Basic metabolic panel  Patient Care Team: Burnis Medin, MD as PCP - General Jari Pigg, MD as Attending Physician (Dermatology) Janne Lab, MD (Obstetrics and Gynecology) Patient Instructions  Will notify you when pap results are available. In one month  Get lab  HIV hep c aby  And repeat chemistry renal function with urine protein check .  If all ok then yearly check   Can do yearly  Stool test for colon cancer screening  Until colonoscopy affordable    Health Maintenance, Female Adopting a healthy lifestyle and getting preventive care can go a long way to promote health and wellness. Talk with your health care provider about what schedule of regular examinations is right for you. This is a good chance for you to check in with your provider about disease prevention and staying healthy. In between checkups, there are plenty of things you can do on your own. Experts have done a lot of research about which lifestyle changes and preventive measures are most likely to keep you healthy. Ask your health care provider for more information. WEIGHT AND DIET  Eat a healthy diet  Be sure to include plenty of vegetables, fruits, low-fat dairy products, and lean protein.  Do not eat a lot of foods high in solid fats, added sugars, or salt.  Get regular exercise. This is one of the most important things you can do for your health.  Most adults should  exercise for at least 150 minutes each week. The exercise should increase your heart rate and make you sweat (moderate-intensity exercise).  Most adults should also do strengthening exercises at least twice a week. This is in addition to the moderate-intensity exercise.  Maintain a healthy weight  Body mass index (BMI) is a measurement that can be used to identify possible weight problems. It estimates body fat based on height and weight. Your health care provider can help determine your BMI and help you achieve or maintain a healthy weight.  For females 47 years of age and older:   A BMI below 18.5 is considered underweight.  A BMI of 18.5 to 24.9 is normal.  A BMI of 25 to 29.9 is considered overweight.  A BMI of 30 and above is considered obese.  Watch levels of cholesterol and blood lipids  You should start having your blood tested for lipids and cholesterol at 61 years of age, then have this test every 5 years.  You may need to have your cholesterol levels checked more often if:  Your lipid or cholesterol levels are high.  You are older than 61 years of age.  You are at high risk for heart disease.  CANCER SCREENING   Lung Cancer  Lung cancer screening is recommended for adults 20-71 years old who are at high risk for lung cancer because of a history of smoking.  A yearly low-dose CT scan of the lungs is recommended for people who:  Currently smoke.  Have quit within the past 15 years.  Have at least a 30-pack-year history of smoking. A pack year is smoking an average of one pack of cigarettes a day for 1 year.  Yearly screening should continue until it has been 15 years since you quit.  Yearly screening should stop if you develop a health problem that would prevent you from having lung cancer treatment.  Breast Cancer  Practice breast self-awareness. This means understanding how your breasts normally appear and  feel.  It also means doing regular breast  self-exams. Let your health care provider know about any changes, no matter how small.  If you are in your 20s or 30s, you should have a clinical breast exam (CBE) by a health care provider every 1-3 years as part of a regular health exam.  If you are 16 or older, have a CBE every year. Also consider having a breast X-ray (mammogram) every year.  If you have a family history of breast cancer, talk to your health care provider about genetic screening.  If you are at high risk for breast cancer, talk to your health care provider about having an MRI and a mammogram every year.  Breast cancer gene (BRCA) assessment is recommended for women who have family members with BRCA-related cancers. BRCA-related cancers include:  Breast.  Ovarian.  Tubal.  Peritoneal cancers.  Results of the assessment will determine the need for genetic counseling and BRCA1 and BRCA2 testing. Cervical Cancer Your health care provider may recommend that you be screened regularly for cancer of the pelvic organs (ovaries, uterus, and vagina). This screening involves a pelvic examination, including checking for microscopic changes to the surface of your cervix (Pap test). You may be encouraged to have this screening done every 3 years, beginning at age 61.  For women ages 21-65, health care providers may recommend pelvic exams and Pap testing every 3 years, or they may recommend the Pap and pelvic exam, combined with testing for human papilloma virus (HPV), every 5 years. Some types of HPV increase your risk of cervical cancer. Testing for HPV may also be done on women of any age with unclear Pap test results.  Other health care providers may not recommend any screening for nonpregnant women who are considered low risk for pelvic cancer and who do not have symptoms. Ask your health care provider if a screening pelvic exam is right for you.  If you have had past treatment for cervical cancer or a condition that could lead  to cancer, you need Pap tests and screening for cancer for at least 20 years after your treatment. If Pap tests have been discontinued, your risk factors (such as having a new sexual partner) need to be reassessed to determine if screening should resume. Some women have medical problems that increase the chance of getting cervical cancer. In these cases, your health care provider may recommend more frequent screening and Pap tests. Colorectal Cancer  This type of cancer can be detected and often prevented.  Routine colorectal cancer screening usually begins at 61 years of age and continues through 61 years of age.  Your health care provider may recommend screening at an earlier age if you have risk factors for colon cancer.  Your health care provider may also recommend using home test kits to check for hidden blood in the stool.  A small camera at the end of a tube can be used to examine your colon directly (sigmoidoscopy or colonoscopy). This is done to check for the earliest forms of colorectal cancer.  Routine screening usually begins at age 26.  Direct examination of the colon should be repeated every 5-10 years through 61 years of age. However, you may need to be screened more often if early forms of precancerous polyps or small growths are found. Skin Cancer  Check your skin from head to toe regularly.  Tell your health care provider about any new moles or changes in moles, especially if there is a change  in a mole's shape or color.  Also tell your health care provider if you have a mole that is larger than the size of a pencil eraser.  Always use sunscreen. Apply sunscreen liberally and repeatedly throughout the day.  Protect yourself by wearing long sleeves, pants, a wide-brimmed hat, and sunglasses whenever you are outside. HEART DISEASE, DIABETES, AND HIGH BLOOD PRESSURE   High blood pressure causes heart disease and increases the risk of stroke. High blood pressure is more  likely to develop in:  People who have blood pressure in the high end of the normal range (130-139/85-89 mm Hg).  People who are overweight or obese.  People who are African American.  If you are 32-31 years of age, have your blood pressure checked every 3-5 years. If you are 26 years of age or older, have your blood pressure checked every year. You should have your blood pressure measured twice--once when you are at a hospital or clinic, and once when you are not at a hospital or clinic. Record the average of the two measurements. To check your blood pressure when you are not at a hospital or clinic, you can use:  An automated blood pressure machine at a pharmacy.  A home blood pressure monitor.  If you are between 41 years and 30 years old, ask your health care provider if you should take aspirin to prevent strokes.  Have regular diabetes screenings. This involves taking a blood sample to check your fasting blood sugar level.  If you are at a normal weight and have a low risk for diabetes, have this test once every three years after 61 years of age.  If you are overweight and have a high risk for diabetes, consider being tested at a younger age or more often. PREVENTING INFECTION  Hepatitis B  If you have a higher risk for hepatitis B, you should be screened for this virus. You are considered at high risk for hepatitis B if:  You were born in a country where hepatitis B is common. Ask your health care provider which countries are considered high risk.  Your parents were born in a high-risk country, and you have not been immunized against hepatitis B (hepatitis B vaccine).  You have HIV or AIDS.  You use needles to inject street drugs.  You live with someone who has hepatitis B.  You have had sex with someone who has hepatitis B.  You get hemodialysis treatment.  You take certain medicines for conditions, including cancer, organ transplantation, and autoimmune  conditions. Hepatitis C  Blood testing is recommended for:  Everyone born from [birth year] through 1966.  Anyone with known risk factors for hepatitis C. Sexually transmitted infections (STIs)  You should be screened for sexually transmitted infections (STIs) including gonorrhea and chlamydia if:  You are sexually active and are younger than 61 years of age.  You are older than 61 years of age and your health care provider tells you that you are at risk for this type of infection.  Your sexual activity has changed since you were last screened and you are at an increased risk for chlamydia or gonorrhea. Ask your health care provider if you are at risk.  If you do not have HIV, but are at risk, it may be recommended that you take a prescription medicine daily to prevent HIV infection. This is called pre-exposure prophylaxis (PrEP). You are considered at risk if:  You are sexually active and do not regularly use condoms  or know the HIV status of your partner(s).  You take drugs by injection.  You are sexually active with a partner who has HIV. Talk with your health care provider about whether you are at high risk of being infected with HIV. If you choose to begin PrEP, you should first be tested for HIV. You should then be tested every 3 months for as long as you are taking PrEP.  PREGNANCY   If you are premenopausal and you may become pregnant, ask your health care provider about preconception counseling.  If you may become pregnant, take 400 to 800 micrograms (mcg) of folic acid every day.  If you want to prevent pregnancy, talk to your health care provider about birth control (contraception). OSTEOPOROSIS AND MENOPAUSE   Osteoporosis is a disease in which the bones lose minerals and strength with aging. This can result in serious bone fractures. Your risk for osteoporosis can be identified using a bone density scan.  If you are 61 years of age or older, or if you are at risk for  osteoporosis and fractures, ask your health care provider if you should be screened.  Ask your health care provider whether you should take a calcium or vitamin D supplement to lower your risk for osteoporosis.  Menopause may have certain physical symptoms and risks.  Hormone replacement therapy may reduce some of these symptoms and risks. Talk to your health care provider about whether hormone replacement therapy is right for you.  HOME CARE INSTRUCTIONS   Schedule regular health, dental, and eye exams.  Stay current with your immunizations.   Do not use any tobacco products including cigarettes, chewing tobacco, or electronic cigarettes.  If you are pregnant, do not drink alcohol.  If you are breastfeeding, limit how much and how often you drink alcohol.  Limit alcohol intake to no more than 1 drink per day for nonpregnant women. One drink equals 12 ounces of beer, 5 ounces of wine, or 1 ounces of hard liquor.  Do not use street drugs.  Do not share needles.  Ask your health care provider for help if you need support or information about quitting drugs.  Tell your health care provider if you often feel depressed.  Tell your health care provider if you have ever been abused or do not feel safe at home.   This information is not intended to replace advice given to you by your health care provider. Make sure you discuss any questions you have with your health care provider.   Document Released: 08/10/2010 Document Revised: 02/15/2014 Document Reviewed: 12/27/2012 Elsevier Interactive Patient Education 2016 [place] K. Panosh M.D.

## 2015-10-15 ENCOUNTER — Ambulatory Visit (INDEPENDENT_AMBULATORY_CARE_PROVIDER_SITE_OTHER): Payer: BLUE CROSS/BLUE SHIELD | Admitting: Internal Medicine

## 2015-10-15 ENCOUNTER — Encounter: Payer: Self-pay | Admitting: Internal Medicine

## 2015-10-15 ENCOUNTER — Other Ambulatory Visit (HOSPITAL_COMMUNITY)
Admission: RE | Admit: 2015-10-15 | Discharge: 2015-10-15 | Disposition: A | Discharge status: Home / Self Care | Payer: BLUE CROSS/BLUE SHIELD | Source: Ambulatory Visit | Attending: Internal Medicine | Admitting: Internal Medicine

## 2015-10-15 VITALS — BP 106/66 | Temp 98.3°F | Ht 66.5 in | Wt 125.0 lb

## 2015-10-15 DIAGNOSIS — Z23 Encounter for immunization: Secondary | ICD-10-CM

## 2015-10-15 DIAGNOSIS — Z01411 Encounter for gynecological examination (general) (routine) with abnormal findings: Secondary | ICD-10-CM | POA: Diagnosis not present

## 2015-10-15 DIAGNOSIS — Z0000 Encounter for general adult medical examination without abnormal findings: Secondary | ICD-10-CM

## 2015-10-15 DIAGNOSIS — E782 Mixed hyperlipidemia: Secondary | ICD-10-CM

## 2015-10-15 DIAGNOSIS — Z1211 Encounter for screening for malignant neoplasm of colon: Secondary | ICD-10-CM | POA: Diagnosis not present

## 2015-10-15 DIAGNOSIS — Z01419 Encounter for gynecological examination (general) (routine) without abnormal findings: Secondary | ICD-10-CM

## 2015-10-15 DIAGNOSIS — Z114 Encounter for screening for human immunodeficiency virus [HIV]: Secondary | ICD-10-CM

## 2015-10-15 DIAGNOSIS — Z1159 Encounter for screening for other viral diseases: Secondary | ICD-10-CM

## 2015-10-15 DIAGNOSIS — R944 Abnormal results of kidney function studies: Secondary | ICD-10-CM

## 2015-10-15 NOTE — Patient Instructions (Addendum)
Will notify you when pap results are available. In one month  Get lab  HIV hep c aby  And repeat chemistry renal function with urine protein check .  If all ok then yearly check   Can do yearly  Stool test for colon cancer screening  Until colonoscopy affordable    Health Maintenance, Female Adopting a healthy lifestyle and getting preventive care can go a long way to promote health and wellness. Talk with your health care provider about what schedule of regular examinations is right for you. This is a good chance for you to check in with your provider about disease prevention and staying healthy. In between checkups, there are plenty of things you can do on your own. Experts have done a lot of research about which lifestyle changes and preventive measures are most likely to keep you healthy. Ask your health care provider for more information. WEIGHT AND DIET  Eat a healthy diet  Be sure to include plenty of vegetables, fruits, low-fat dairy products, and lean protein.  Do not eat a lot of foods high in solid fats, added sugars, or salt.  Get regular exercise. This is one of the most important things you can do for your health.  Most adults should exercise for at least 150 minutes each week. The exercise should increase your heart rate and make you sweat (moderate-intensity exercise).  Most adults should also do strengthening exercises at least twice a week. This is in addition to the moderate-intensity exercise.  Maintain a healthy weight  Body mass index (BMI) is a measurement that can be used to identify possible weight problems. It estimates body fat based on height and weight. Your health care provider can help determine your BMI and help you achieve or maintain a healthy weight.  For females 61 years of age and older:   A BMI below 18.5 is considered underweight.  A BMI of 18.5 to 24.9 is normal.  A BMI of 25 to 29.9 is considered overweight.  A BMI of 30 and above is  considered obese.  Watch levels of cholesterol and blood lipids  You should start having your blood tested for lipids and cholesterol at 61 years of age, then have this test every 5 years.  You may need to have your cholesterol levels checked more often if:  Your lipid or cholesterol levels are high.  You are older than 61 years of age.  You are at high risk for heart disease.  CANCER SCREENING   Lung Cancer  Lung cancer screening is recommended for adults 52-84 years old who are at high risk for lung cancer because of a history of smoking.  A yearly low-dose CT scan of the lungs is recommended for people who:  Currently smoke.  Have quit within the past 15 years.  Have at least a 30-pack-year history of smoking. A pack year is smoking an average of one pack of cigarettes a day for 1 year.  Yearly screening should continue until it has been 15 years since you quit.  Yearly screening should stop if you develop a health problem that would prevent you from having lung cancer treatment.  Breast Cancer  Practice breast self-awareness. This means understanding how your breasts normally appear and feel.  It also means doing regular breast self-exams. Let your health care provider know about any changes, no matter how small.  If you are in your 20s or 30s, you should have a clinical breast exam (CBE) by a  health care provider every 1-3 years as part of a regular health exam.  If you are 40 or older, have a CBE every year. Also consider having a breast X-ray (mammogram) every year.  If you have a family history of breast cancer, talk to your health care provider about genetic screening.  If you are at high risk for breast cancer, talk to your health care provider about having an MRI and a mammogram every year.  Breast cancer gene (BRCA) assessment is recommended for women who have family members with BRCA-related cancers. BRCA-related cancers  include:  Breast.  Ovarian.  Tubal.  Peritoneal cancers.  Results of the assessment will determine the need for genetic counseling and BRCA1 and BRCA2 testing. Cervical Cancer Your health care provider may recommend that you be screened regularly for cancer of the pelvic organs (ovaries, uterus, and vagina). This screening involves a pelvic examination, including checking for microscopic changes to the surface of your cervix (Pap test). You may be encouraged to have this screening done every 3 years, beginning at age 21.  For women ages 30-65, health care providers may recommend pelvic exams and Pap testing every 3 years, or they may recommend the Pap and pelvic exam, combined with testing for human papilloma virus (HPV), every 5 years. Some types of HPV increase your risk of cervical cancer. Testing for HPV may also be done on women of any age with unclear Pap test results.  Other health care providers may not recommend any screening for nonpregnant women who are considered low risk for pelvic cancer and who do not have symptoms. Ask your health care provider if a screening pelvic exam is right for you.  If you have had past treatment for cervical cancer or a condition that could lead to cancer, you need Pap tests and screening for cancer for at least 20 years after your treatment. If Pap tests have been discontinued, your risk factors (such as having a new sexual partner) need to be reassessed to determine if screening should resume. Some women have medical problems that increase the chance of getting cervical cancer. In these cases, your health care provider may recommend more frequent screening and Pap tests. Colorectal Cancer  This type of cancer can be detected and often prevented.  Routine colorectal cancer screening usually begins at 61 years of age and continues through 61 years of age.  Your health care provider may recommend screening at an earlier age if you have risk factors for  colon cancer.  Your health care provider may also recommend using home test kits to check for hidden blood in the stool.  A small camera at the end of a tube can be used to examine your colon directly (sigmoidoscopy or colonoscopy). This is done to check for the earliest forms of colorectal cancer.  Routine screening usually begins at age 50.  Direct examination of the colon should be repeated every 5-10 years through 61 years of age. However, you may need to be screened more often if early forms of precancerous polyps or small growths are found. Skin Cancer  Check your skin from head to toe regularly.  Tell your health care provider about any new moles or changes in moles, especially if there is a change in a mole's shape or color.  Also tell your health care provider if you have a mole that is larger than the size of a pencil eraser.  Always use sunscreen. Apply sunscreen liberally and repeatedly throughout the day.    Protect yourself by wearing long sleeves, pants, a wide-brimmed hat, and sunglasses whenever you are outside. HEART DISEASE, DIABETES, AND HIGH BLOOD PRESSURE   High blood pressure causes heart disease and increases the risk of stroke. High blood pressure is more likely to develop in:  People who have blood pressure in the high end of the normal range (130-139/85-89 mm Hg).  People who are overweight or obese.  People who are African American.  If you are 18-39 years of age, have your blood pressure checked every 3-5 years. If you are 40 years of age or older, have your blood pressure checked every year. You should have your blood pressure measured twice--once when you are at a hospital or clinic, and once when you are not at a hospital or clinic. Record the average of the two measurements. To check your blood pressure when you are not at a hospital or clinic, you can use:  An automated blood pressure machine at a pharmacy.  A home blood pressure monitor.  If you  are between 55 years and 79 years old, ask your health care provider if you should take aspirin to prevent strokes.  Have regular diabetes screenings. This involves taking a blood sample to check your fasting blood sugar level.  If you are at a normal weight and have a low risk for diabetes, have this test once every three years after 61 years of age.  If you are overweight and have a high risk for diabetes, consider being tested at a younger age or more often. PREVENTING INFECTION  Hepatitis B  If you have a higher risk for hepatitis B, you should be screened for this virus. You are considered at high risk for hepatitis B if:  You were born in a country where hepatitis B is common. Ask your health care provider which countries are considered high risk.  Your parents were born in a high-risk country, and you have not been immunized against hepatitis B (hepatitis B vaccine).  You have HIV or AIDS.  You use needles to inject street drugs.  You live with someone who has hepatitis B.  You have had sex with someone who has hepatitis B.  You get hemodialysis treatment.  You take certain medicines for conditions, including cancer, organ transplantation, and autoimmune conditions. Hepatitis C  Blood testing is recommended for:  Everyone born from 1945 through 1965.  Anyone with known risk factors for hepatitis C. Sexually transmitted infections (STIs)  You should be screened for sexually transmitted infections (STIs) including gonorrhea and chlamydia if:  You are sexually active and are younger than 61 years of age.  You are older than 61 years of age and your health care provider tells you that you are at risk for this type of infection.  Your sexual activity has changed since you were last screened and you are at an increased risk for chlamydia or gonorrhea. Ask your health care provider if you are at risk.  If you do not have HIV, but are at risk, it may be recommended that you  take a prescription medicine daily to prevent HIV infection. This is called pre-exposure prophylaxis (PrEP). You are considered at risk if:  You are sexually active and do not regularly use condoms or know the HIV status of your partner(s).  You take drugs by injection.  You are sexually active with a partner who has HIV. Talk with your health care provider about whether you are at high risk of being infected   with HIV. If you choose to begin PrEP, you should first be tested for HIV. You should then be tested every 3 months for as long as you are taking PrEP.  PREGNANCY   If you are premenopausal and you may become pregnant, ask your health care provider about preconception counseling.  If you may become pregnant, take 400 to 800 micrograms (mcg) of folic acid every day.  If you want to prevent pregnancy, talk to your health care provider about birth control (contraception). OSTEOPOROSIS AND MENOPAUSE   Osteoporosis is a disease in which the bones lose minerals and strength with aging. This can result in serious bone fractures. Your risk for osteoporosis can be identified using a bone density scan.  If you are 28 years of age or older, or if you are at risk for osteoporosis and fractures, ask your health care provider if you should be screened.  Ask your health care provider whether you should take a calcium or vitamin D supplement to lower your risk for osteoporosis.  Menopause may have certain physical symptoms and risks.  Hormone replacement therapy may reduce some of these symptoms and risks. Talk to your health care provider about whether hormone replacement therapy is right for you.  HOME CARE INSTRUCTIONS   Schedule regular health, dental, and eye exams.  Stay current with your immunizations.   Do not use any tobacco products including cigarettes, chewing tobacco, or electronic cigarettes.  If you are pregnant, do not drink alcohol.  If you are breastfeeding, limit how  much and how often you drink alcohol.  Limit alcohol intake to no more than 1 drink per day for nonpregnant women. One drink equals 12 ounces of beer, 5 ounces of wine, or 1 ounces of hard liquor.  Do not use street drugs.  Do not share needles.  Ask your health care provider for help if you need support or information about quitting drugs.  Tell your health care provider if you often feel depressed.  Tell your health care provider if you have ever been abused or do not feel safe at home.   This information is not intended to replace advice given to you by your health care provider. Make sure you discuss any questions you have with your health care provider.   Document Released: 08/10/2010 Document Revised: 02/15/2014 Document Reviewed: 12/27/2012 Elsevier Interactive Patient Education [employer].

## 2015-10-17 LAB — CYTOLOGY - PAP

## 2015-10-21 ENCOUNTER — Telehealth: Payer: Self-pay | Admitting: Internal Medicine

## 2015-10-21 NOTE — Telephone Encounter (Signed)
Pt needs hep c screening. Please put order in system. Pt would like to come in this thurs or friday

## 2015-10-21 NOTE — Telephone Encounter (Signed)
Pt has been sch

## 2015-10-21 NOTE — Telephone Encounter (Signed)
Return for lab in one month if ok yearly check up.    Lab orders are in.  She was last seen on 10/15/15 for cpx.  Dr. Mamie Nick said to come back in one month.  She is having other lab work.

## 2015-10-22 ENCOUNTER — Encounter: Payer: Self-pay | Admitting: Family Medicine

## 2015-10-22 ENCOUNTER — Telehealth: Payer: Self-pay | Admitting: Internal Medicine

## 2015-10-22 NOTE — Telephone Encounter (Signed)
Misty pt calling you back from this morning and is very worried b/c she has already scheduled her lab work and not knowing what you are needing.

## 2015-10-22 NOTE — Telephone Encounter (Signed)
Pt notified of Pap results.

## 2015-11-12 ENCOUNTER — Other Ambulatory Visit: Payer: BLUE CROSS/BLUE SHIELD

## 2015-11-19 ENCOUNTER — Other Ambulatory Visit (INDEPENDENT_AMBULATORY_CARE_PROVIDER_SITE_OTHER): Payer: BLUE CROSS/BLUE SHIELD

## 2015-11-19 DIAGNOSIS — R944 Abnormal results of kidney function studies: Secondary | ICD-10-CM

## 2015-11-19 DIAGNOSIS — Z114 Encounter for screening for human immunodeficiency virus [HIV]: Secondary | ICD-10-CM

## 2015-11-19 DIAGNOSIS — Z1159 Encounter for screening for other viral diseases: Secondary | ICD-10-CM

## 2015-11-19 LAB — BASIC METABOLIC PANEL
BUN: 14 mg/dL (ref 6–23)
CALCIUM: 9.4 mg/dL (ref 8.4–10.5)
CO2: 26 meq/L (ref 19–32)
CREATININE: 1.02 mg/dL (ref 0.40–1.20)
Chloride: 105 mEq/L (ref 96–112)
GFR: 58.52 mL/min — ABNORMAL LOW (ref >60.00)
GLUCOSE: 108 mg/dL — AB (ref 70–99)
Potassium: 4.2 mEq/L (ref 3.5–5.1)
Sodium: 140 mEq/L (ref 135–145)

## 2015-11-19 NOTE — Progress Notes (Unsigned)
Patient refused HIV and HEP C test

## 2015-11-20 LAB — PROTEIN / CREATININE RATIO, URINE
Creatinine, Urine: 123 mg/dL (ref 20–320)
PROTEIN CREATININE RATIO: 81 mg/g{creat} (ref 21–161)
Total Protein, Urine: 10 mg/dL (ref 5–24)

## 2015-11-25 ENCOUNTER — Telehealth: Payer: Self-pay | Admitting: Internal Medicine

## 2015-11-25 NOTE — Telephone Encounter (Signed)
Please call patient around 3 with lab results.

## 2015-11-25 NOTE — Telephone Encounter (Signed)
Pt was notified of lab results, per Dr. Regis Bill.

## 2016-03-17 ENCOUNTER — Other Ambulatory Visit: Payer: Self-pay | Admitting: Internal Medicine

## 2016-03-17 DIAGNOSIS — Z1231 Encounter for screening mammogram for malignant neoplasm of breast: Secondary | ICD-10-CM

## 2016-03-22 ENCOUNTER — Ambulatory Visit: Payer: BLUE CROSS/BLUE SHIELD

## 2016-03-31 ENCOUNTER — Other Ambulatory Visit: Payer: BLUE CROSS/BLUE SHIELD

## 2016-03-31 DIAGNOSIS — C189 Malignant neoplasm of colon, unspecified: Secondary | ICD-10-CM

## 2016-04-05 ENCOUNTER — Ambulatory Visit
Admission: RE | Admit: 2016-04-05 | Discharge: 2016-04-05 | Disposition: A | Discharge status: Home / Self Care | Payer: BLUE CROSS/BLUE SHIELD | Source: Ambulatory Visit | Attending: Internal Medicine | Admitting: Internal Medicine

## 2016-04-05 ENCOUNTER — Other Ambulatory Visit (INDEPENDENT_AMBULATORY_CARE_PROVIDER_SITE_OTHER): Payer: BLUE CROSS/BLUE SHIELD

## 2016-04-05 ENCOUNTER — Other Ambulatory Visit: Payer: Self-pay | Admitting: Internal Medicine

## 2016-04-05 DIAGNOSIS — Z1231 Encounter for screening mammogram for malignant neoplasm of breast: Secondary | ICD-10-CM

## 2016-04-05 DIAGNOSIS — K921 Melena: Secondary | ICD-10-CM

## 2016-04-05 LAB — FECAL OCCULT BLOOD, IMMUNOCHEMICAL: Fecal Occult Bld: NEGATIVE

## 2016-05-31 ENCOUNTER — Ambulatory Visit (INDEPENDENT_AMBULATORY_CARE_PROVIDER_SITE_OTHER): Payer: BLUE CROSS/BLUE SHIELD | Admitting: Family Medicine

## 2016-05-31 ENCOUNTER — Encounter: Payer: Self-pay | Admitting: Family Medicine

## 2016-05-31 VITALS — BP 100/70 | HR 73 | Temp 99.3°F | Ht 66.5 in | Wt 128.3 lb

## 2016-05-31 DIAGNOSIS — R300 Dysuria: Secondary | ICD-10-CM | POA: Diagnosis not present

## 2016-05-31 LAB — POCT URINALYSIS DIPSTICK
Bilirubin, UA: NEGATIVE
Blood, UA: NEGATIVE
Glucose, UA: NEGATIVE
Ketones, UA: NEGATIVE
NITRITE UA: NEGATIVE
PH UA: 7.5 (ref 5.0–8.0)
PROTEIN UA: NEGATIVE
Spec Grav, UA: <=1.005 — AB (ref 1.010–1.025)
UROBILINOGEN UA: 0.2 U/dL

## 2016-05-31 NOTE — Addendum Note (Signed)
Addended by: Agnes Lawrence on: 05/31/2016 04:43 PM   Modules accepted: Orders

## 2016-05-31 NOTE — Progress Notes (Signed)
HPI:  Acute visit for dysuria: -x3-4 days -not usually sexually active, sexually active with her one partner a few days ago -symptoms: burning achy discomfort with urination, mild frequency -tried homeopathic med her chiropractor recommends for UTIs -no fevers, malaise, hematuria, flank/abd pain, vag discharge, concern for UTI, NVD  ROS: See pertinent positives and negatives per HPI.  Past Medical History:  Diagnosis Date  . Breast cyst   . Desensitization to allergy shot    hx of as a child   . Exercise-induced asthma   . Rectal bleeding 07/09/2010   Probably was beet juice    . Treadmill stress test negative for angina pectoris    neg myoview 2008    Past Surgical History:  Procedure Laterality Date  . childbirth x 2    . hx allergy shots    . left knee reconstruction  1989  . myoview stess     negative 2008    Family History  Problem Relation Age of Onset  . Anemia Mother   . Parkinsonism Mother 35  . Coronary artery disease Father 72  . Healthy Sister   . Healthy Brother   . Healthy Sister   . Healthy Sister     Social History   Social History  . Marital status: Married    Spouse name: N/A  . Number of children: N/A  . Years of education: N/A   Social History Main Topics  . Smoking status: Former Research scientist (life sciences)  . Smokeless tobacco: Never Used  . Alcohol use Yes     Comment: occasionally   . Drug use: No  . Sexual activity: No   Other Topics Concern  . None   Social History Narrative   Married   Former smoker   Regular Exercise- yes   hh of 2     1 cats   Child biirth x 2            Current Outpatient Prescriptions:  .  DIGESTIVE ENZYMES PO, Take by mouth., Disp: , Rfl:  .  Multiple Vitamins-Minerals (MULTIVITAMIN PO), Take 1 tablet by mouth daily., Disp: , Rfl:  .  Omega-3 Fatty Acids (OMEGA 3 PO), Take by mouth., Disp: , Rfl:  .  OVER THE COUNTER MEDICATION, Paranorm (for parasites), Disp: , Rfl:  .  OVER THE COUNTER MEDICATION,  Transitions (herbal supplement for menopause), Disp: , Rfl:   EXAM:  Vitals:   05/31/16 1454  BP: 100/70  Pulse: 73  Temp: 99.3 F (37.4 C)    Body mass index is 20.4 kg/m.  GENERAL: vitals reviewed and listed above, alert, oriented, appears well hydrated and in no acute distress  HEENT: atraumatic, conjunttiva clear, no obvious abnormalities on inspection of external nose and ears  NECK: no obvious masses on inspection  LUNGS: clear to auscultation bilaterally, no wheezes, rales or rhonchi, good air movement  CV: HRRR, no peripheral edema  ABD: soft, NTTP, no CVA TTP  MS: moves all extremities without noticeable abnormality  PSYCH: pleasant and cooperative, no obvious depression or anxiety  ASSESSMENT AND PLAN:  Discussed the following assessment and plan:  Dysuria - Plan: POC Urinalysis Dipstick  -udip with tr leuks, she prefers to wait on culture prior to abx -in interim plans to stay hydrated and try cranberry juice, void after intercourse -Patient advised to return or notify a doctor immediately if symptoms worsen or persist or new concerns arise.  Declined avs.  There are no Patient Instructions on file for this visit.  Colin Benton R., DO

## 2016-05-31 NOTE — Progress Notes (Signed)
Pre visit review using our clinic review tool, if applicable. No additional management support is needed unless otherwise documented below in the visit note. 

## 2016-06-02 LAB — URINE CULTURE: Organism ID, Bacteria: NO GROWTH

## 2016-06-23 ENCOUNTER — Telehealth: Payer: Self-pay | Admitting: Internal Medicine

## 2016-06-23 MED ORDER — CIPROFLOXACIN HCL 500 MG PO TABS: 500.0000 mg | ORAL_TABLET | Freq: Two times a day (BID) | ORAL | 0 refills | Status: DC

## 2016-06-23 NOTE — Telephone Encounter (Signed)
Please advise 

## 2016-06-23 NOTE — Telephone Encounter (Signed)
The patient came in stating that she is going to [place] next week and was wondering if she can get a Rx for Cipro just in case they get diarrhea while they are gone. Please advise

## 2016-06-23 NOTE — Telephone Encounter (Signed)
Sent in

## 2016-06-24 NOTE — Telephone Encounter (Signed)
Left a VM for pt regarding medication being sent to pharmacy

## 2016-10-14 NOTE — Progress Notes (Signed)
Chief Complaint  Patient presents with  . Annual Exam    HPI: Patient  Lynn Griffin  61 y.o. comes in today for Preventive Health Care visit   Health Maintenance  Topic Date Due  . COLONOSCOPY  10/29/2015  . INFLUENZA VACCINE  05/08/2017 (Originally 09/08/2016)  . Hepatitis C Screening  10/19/2017 (Originally July 31, 1955)  . HIV Screening  10/19/2017 (Originally 09/16/1970)  . MAMMOGRAM  04/05/2018  . PAP SMEAR  10/15/2018  . TETANUS/TDAP  09/03/2024   Health Maintenance Review LIFESTYLE:  Exercise:   Bike gym yoga tennis  Tobacco/ETS: no Alcohol:  4 per week  Sugar beverages: no Sleep:   irreg   Interrupted    6- 7  Warm  Lightened  Drug use: no HH of   2  No pets  Work:  Self employed .     ROS:  Right tonsil gets stones has small white spot area ? Any concern  Right ear dec wax?    GEN/ HEENT: No fever, significant weight changes sweats headaches vision problems hearing changes, CV/ PULM; No chest pain shortness of breath cough, syncope,edema  change in exercise tolerance. GI /GU: No adominal pain, vomiting, change in bowel habits. No blood in the stool. No significant GU symptoms. SKIN/HEME: ,no acute skin rashes suspicious lesions or bleeding. No lymphadenopathy, nodules, masses.  NEURO/ PSYCH:  No neurologic signs such as weakness numbness. No depression anxiety. IMM/ Allergy: No unusual infections.  Allergy .   REST of 12 system review negative except as per HPI   Past Medical History:  Diagnosis Date  . Breast cyst   . Desensitization to allergy shot    hx of as a child   . Exercise-induced asthma   . Rectal bleeding 07/09/2010   Probably was beet juice    . Treadmill stress test negative for angina pectoris    neg myoview 2008    Past Surgical History:  Procedure Laterality Date  . childbirth x 2    . hx allergy shots    . left knee reconstruction  1989  . myoview stess     negative 2008    Family History  Problem Relation Age of Onset  . Anemia  Mother   . Parkinsonism Mother 40  . Coronary artery disease Father 23  . Ovarian cancer Sister   . Healthy Brother   . Healthy Sister   . Healthy Sister     Social History   Social History  . Marital status: Married    Spouse name: N/A  . Number of children: N/A  . Years of education: N/A   Social History Main Topics  . Smoking status: Former Research scientist (life sciences)  . Smokeless tobacco: Never Used  . Alcohol use Yes     Comment: occasionally   . Drug use: No  . Sexual activity: No   Other Topics Concern  . None   Social History Narrative   Married   Former smoker   Regular Exercise- yes   hh of 2     1 cats   Child biirth x 2           Outpatient Medications Prior to Visit  Medication Sig Dispense Refill  . DIGESTIVE ENZYMES PO Take by mouth.    . Multiple Vitamins-Minerals (MULTIVITAMIN PO) Take 1 tablet by mouth daily.    . Omega-3 Fatty Acids (OMEGA 3 PO) Take by mouth.    Marland Kitchen OVER THE COUNTER MEDICATION Paranorm (for parasites)    .  OVER THE COUNTER MEDICATION Transitions (herbal supplement for menopause)    . ciprofloxacin (CIPRO) 500 MG tablet Take 1 tablet (500 mg total) by mouth 2 (two) times daily. If needed for travelers diarrhea 6 tablet 0   No facility-administered medications prior to visit.      EXAM:  BP 100/60 (BP Location: Right Arm, Patient Position: Sitting, Cuff Size: Normal)   Pulse 72   Temp 98.5 F (36.9 C) (Oral)   Ht 5' 6.25" (1.683 m)   Wt 125 lb 3.2 oz (56.8 kg)   LMP 02/08/2010   BMI 20.06 kg/m   Body mass index is 20.06 kg/m. Wt Readings from Last 3 Encounters:  10/19/16 125 lb 3.2 oz (56.8 kg)  05/31/16 128 lb 4.8 oz (58.2 kg)  10/15/15 125 lb (56.7 kg)    Physical Exam: Vital signs reviewed OVF:IEPP is a well-developed well-nourished alert cooperative    who appearsr stated age in no acute distress.  HEENT: normocephalic atraumatic , Eyes: PERRL EOM's full, conjunctiva clear, Nares: paten,t no deformity discharge or tenderness.,  Ears: no deformity EAC's 2+ wax right ear.  TMs with normal landmarks. Mouth: clear OP, 82m whitish smooth round  area in tonsil no redness or mass note d no , edema.  Moist mucous membranes. Dentition in adequate repair. NECK: supple without masses, thyromegaly or bruits. CHEST/PULM:  Clear to auscultation and percussion breath sounds equal no wheeze , rales or rhonchi. No chest wall deformities or tenderness. Breast: normal by inspection . No dimpling, discharge, masses, tenderness or discharge . CV: PMI is nondisplaced, S1 S2 no gallops, murmurs, rubs. Peripheral pulses are full without delay.No JVD .  ABDOMEN: Bowel sounds normal nontender  No guard or rebound, no hepato splenomegal no CVA tenderness.  No hernia. Extremtities:  No clubbing cyanosis or edema, no acute joint swelling or redness no focal atrophy NEURO:  Oriented x3, cranial nerves 3-12 appear to be intact, no obvious focal weakness,gait within normal limits no abnormal reflexes or asymmetrical SKIN: No acute rashes normal turgor, color, no bruising or petechiae. PSYCH: Oriented, good eye contact, no obvious depression anxiety, cognition and judgment appear normal. LN: no cervical axillary inguinal adenopathy  Lab Results  Component Value Date   WBC 5.3 10/03/2015   HGB 13.4 10/03/2015   HCT 39.3 10/03/2015   PLT 293.0 10/03/2015   GLUCOSE 108 (H) 11/19/2015   CHOL 254 (H) 10/03/2015   TRIG 54.0 10/03/2015   HDL 82.60 10/03/2015   LDLDIRECT 185.9 08/15/2012   LDLCALC 160 (H) 10/03/2015   ALT 18 10/03/2015   AST 22 10/03/2015   NA 140 11/19/2015   K 4.2 11/19/2015   CL 105 11/19/2015   CREATININE 1.02 11/19/2015   BUN 14 11/19/2015   CO2 26 11/19/2015   TSH 2.98 10/03/2015    BP Readings from Last 3 Encounters:  10/19/16 100/60  05/31/16 100/70  10/15/15 106/66    Lab results reviewed with patient   ASSESSMENT AND PLAN:  Discussed the following assessment and plan:  Visit for preventive health  examination - Plan: Basic metabolic panel, CBC with Differential/Platelet, Hemoglobin A1c, Hepatic function panel, Lipid panel, TSH  HYPERLIPIDEMIA - Plan: Basic metabolic panel, CBC with Differential/Platelet, Hemoglobin A1c, Hepatic function panel, Lipid panel, TSH  Fasting hyperglycemia - Plan: Basic metabolic panel, CBC with Differential/Platelet, Hemoglobin A1c, Hepatic function panel, Lipid panel, TSH  Decreased GFR x 1  - following Can come back fasting for lab orders. We'll check into cologuard for colon cancer screening  her last colon was clean 10 year follow-up negative significant family history.  Patient Care Team: Burnis Medin, MD as PCP - Patsi Sears, MD as Attending Physician (Dermatology) Janne Lab, MD (Obstetrics and Gynecology) Patient Instructions  Continue lifestyle intervention healthy eating and exercise . Continued attention to sleep hygiene .  Temperature back lighting . Check with  Insurance     About  cologuard for   Colon cancer screening   Let us know so we can order this test.  Make appt for fasting labs  I will place the orders  Check into shingles vaccine. Shingrix  Can receive either in the office or pharmacy depending on insurance rules. If the area in righ tonsil gets changing  Let us check and can get ent ot check but seems ok at this time.      Insomnia Insomnia is a sleep disorder that makes it difficult to fall asleep or to stay asleep. Insomnia can cause tiredness (fatigue), low energy, difficulty concentrating, mood swings, and poor performance at work or school. There are three different ways to classify insomnia:  Difficulty falling asleep.  Difficulty staying asleep.  Waking up too early in the morning.  Any type of insomnia can be long-term (chronic) or short-term (acute). Both are common. Short-term insomnia usually lasts for three months or less. Chronic insomnia occurs at least three times a week for longer than three  months. What are the causes? Insomnia may be caused by another condition, situation, or substance, such as:  Anxiety.  Certain medicines.  Gastroesophageal reflux disease (GERD) or other gastrointestinal conditions.  Asthma or other breathing conditions.  Restless legs syndrome, sleep apnea, or other sleep disorders.  Chronic pain.  Menopause. This may include hot flashes.  Stroke.  Abuse of alcohol, tobacco, or illegal drugs.  Depression.  Caffeine.  Neurological disorders, such as Alzheimer disease.  An overactive thyroid (hyperthyroidism).  The cause of insomnia may not be known. What increases the risk? Risk factors for insomnia include:  Gender. Women are more commonly affected than men.  Age. Insomnia is more common as you get older.  Stress. This may involve your professional or personal life.  Income. Insomnia is more common in people with lower income.  Lack of exercise.  Irregular work schedule or night shifts.  Traveling between different time zones.  What are the signs or symptoms? If you have insomnia, trouble falling asleep or trouble staying asleep is the main symptom. This may lead to other symptoms, such as:  Feeling fatigued.  Feeling nervous about going to sleep.  Not feeling rested in the morning.  Having trouble concentrating.  Feeling irritable, anxious, or depressed.  How is this treated? Treatment for insomnia depends on the cause. If your insomnia is caused by an underlying condition, treatment will focus on addressing the condition. Treatment may also include:  Medicines to help you sleep.  Counseling or therapy.  Lifestyle adjustments.  Follow these instructions at home:  Take medicines only as directed by your health care provider.  Keep regular sleeping and waking hours. Avoid naps.  Keep a sleep diary to help you and your health care provider figure out what could be causing your insomnia. Include: ? When you  sleep. ? When you wake up during the night. ? How well you sleep. ? How rested you feel the next day. ? Any side effects of medicines you are taking. ? What you eat and drink.  Make your bedroom a comfortable place  where it is easy to fall asleep: ? Put up shades or special blackout curtains to block light from outside. ? Use a white noise machine to block noise. ? Keep the temperature cool.  Exercise regularly as directed by your health care provider. Avoid exercising right before bedtime.  Use relaxation techniques to manage stress. Ask your health care provider to suggest some techniques that may work well for you. These may include: ? Breathing exercises. ? Routines to release muscle tension. ? Visualizing peaceful scenes.  Cut back on alcohol, caffeinated beverages, and cigarettes, especially close to bedtime. These can disrupt your sleep.  Do not overeat or eat spicy foods right before bedtime. This can lead to digestive discomfort that can make it hard for you to sleep.  Limit screen use before bedtime. This includes: ? Watching TV. ? Using your smartphone, tablet, and computer.  Stick to a routine. This can help you fall asleep faster. Try to do a quiet activity, brush your teeth, and go to bed at the same time each night.  Get out of bed if you are still awake after 15 minutes of trying to sleep. Keep the lights down, but try reading or doing a quiet activity. When you feel sleepy, go back to bed.  Make sure that you drive carefully. Avoid driving if you feel very sleepy.  Keep all follow-up appointments as directed by your health care provider. This is important. Contact a health care provider if:  You are tired throughout the day or have trouble in your daily routine due to sleepiness.  You continue to have sleep problems or your sleep problems get worse. Get help right away if:  You have serious thoughts about hurting yourself or someone else. This information is  not intended to replace advice given to you by your health care provider. Make sure you discuss any questions you have with your health care provider. Document Released: 01/23/2000 Document Revised: 06/27/2015 Document Reviewed: 10/26/2013 Elsevier Interactive Patient Education  2018 [place] Years, Female Preventive care refers to lifestyle choices and visits with your health care provider that can promote health and wellness. What does preventive care include?  A yearly physical exam. This is also called an annual well check.  Dental exams once or twice a year.  Routine eye exams. Ask your health care provider how often you should have your eyes checked.  Personal lifestyle choices, including: ? Daily care of your teeth and gums. ? Regular physical activity. ? Eating a healthy diet. ? Avoiding tobacco and drug use. ? Limiting alcohol use. ? Practicing safe sex. ? Taking low-dose aspirin daily starting at age 49. ? Taking vitamin and mineral supplements as recommended by your health care provider. What happens during an annual well check? The services and screenings done by your health care provider during your annual well check will depend on your age, overall health, lifestyle risk factors, and family history of disease. Counseling Your health care provider may ask you questions about your:  Alcohol use.  Tobacco use.  Drug use.  Emotional well-being.  Home and relationship well-being.  Sexual activity.  Eating habits.  Work and work Statistician.  Method of birth control.  Menstrual cycle.  Pregnancy history.  Screening You may have the following tests or measurements:  Height, weight, and BMI.  Blood pressure.  Lipid and cholesterol levels. These may be checked every 5 years, or more frequently if you are over 84 years old.  Skin check.  Lung cancer screening. You may have this screening every year starting at age 93 if  you have a 30-pack-year history of smoking and currently smoke or have quit within the past 15 years.  Fecal occult blood test (FOBT) of the stool. You may have this test every year starting at age 98.  Flexible sigmoidoscopy or colonoscopy. You may have a sigmoidoscopy every 5 years or a colonoscopy every 10 years starting at age 67.  Hepatitis C blood test.  Hepatitis B blood test.  Sexually transmitted disease (STD) testing.  Diabetes screening. This is done by checking your blood sugar (glucose) after you have not eaten for a while (fasting). You may have this done every 1-3 years.  Mammogram. This may be done every 1-2 years. Talk to your health care provider about when you should start having regular mammograms. This may depend on whether you have a family history of breast cancer.  BRCA-related cancer screening. This may be done if you have a family history of breast, ovarian, tubal, or peritoneal cancers.  Pelvic exam and Pap test. This may be done every 3 years starting at age 5. Starting at age 35, this may be done every 5 years if you have a Pap test in combination with an HPV test.  Bone density scan. This is done to screen for osteoporosis. You may have this scan if you are at high risk for osteoporosis.  Discuss your test results, treatment options, and if necessary, the need for more tests with your health care provider. Vaccines Your health care provider may recommend certain vaccines, such as:  Influenza vaccine. This is recommended every year.  Tetanus, diphtheria, and acellular pertussis (Tdap, Td) vaccine. You may need a Td booster every 10 years.  Varicella vaccine. You may need this if you have not been vaccinated.  Zoster vaccine. You may need this after age 65.  Measles, mumps, and rubella (MMR) vaccine. You may need at least one dose of MMR if you were born in 1957 or later. You may also need a second dose.  Pneumococcal 13-valent conjugate (PCV13)  vaccine. You may need this if you have certain conditions and were not previously vaccinated.  Pneumococcal polysaccharide (PPSV23) vaccine. You may need one or two doses if you smoke cigarettes or if you have certain conditions.  Meningococcal vaccine. You may need this if you have certain conditions.  Hepatitis A vaccine. You may need this if you have certain conditions or if you travel or work in places where you may be exposed to hepatitis A.  Hepatitis B vaccine. You may need this if you have certain conditions or if you travel or work in places where you may be exposed to hepatitis B.  Haemophilus influenzae type b (Hib) vaccine. You may need this if you have certain conditions.  Talk to your health care provider about which screenings and vaccines you need and how often you need them. This information is not intended to replace advice given to you by your health care provider. Make sure you discuss any questions you have with your health care provider. Document Released: 02/21/2015 Document Revised: 10/15/2015 Document Reviewed: 11/26/2014 Elsevier Interactive Patient Education  2017 [place]. Elektra Wartman M.D.

## 2016-10-19 ENCOUNTER — Ambulatory Visit (INDEPENDENT_AMBULATORY_CARE_PROVIDER_SITE_OTHER): Payer: BLUE CROSS/BLUE SHIELD | Admitting: Internal Medicine

## 2016-10-19 ENCOUNTER — Encounter: Payer: Self-pay | Admitting: Internal Medicine

## 2016-10-19 VITALS — BP 100/60 | HR 72 | Temp 98.5°F | Ht 66.25 in | Wt 125.2 lb

## 2016-10-19 DIAGNOSIS — R944 Abnormal results of kidney function studies: Secondary | ICD-10-CM | POA: Diagnosis not present

## 2016-10-19 DIAGNOSIS — E782 Mixed hyperlipidemia: Secondary | ICD-10-CM | POA: Diagnosis not present

## 2016-10-19 DIAGNOSIS — R7301 Impaired fasting glucose: Secondary | ICD-10-CM

## 2016-10-19 DIAGNOSIS — Z0000 Encounter for general adult medical examination without abnormal findings: Secondary | ICD-10-CM | POA: Diagnosis not present

## 2016-10-19 NOTE — Patient Instructions (Addendum)
Continue lifestyle intervention healthy eating and exercise . Continued attention to sleep hygiene .  Temperature back lighting . Check with  Insurance     About  cologuard for   Colon cancer screening   Let us know so we can order this test.  Make appt for fasting labs  I will place the orders  Check into shingles vaccine. Shingrix  Can receive either in the office or pharmacy depending on insurance rules. If the area in righ tonsil gets changing  Let us check and can get ent ot check but seems ok at this time.      Insomnia Insomnia is a sleep disorder that makes it difficult to fall asleep or to stay asleep. Insomnia can cause tiredness (fatigue), low energy, difficulty concentrating, mood swings, and poor performance at work or school. There are three different ways to classify insomnia:  Difficulty falling asleep.  Difficulty staying asleep.  Waking up too early in the morning.  Any type of insomnia can be long-term (chronic) or short-term (acute). Both are common. Short-term insomnia usually lasts for three months or less. Chronic insomnia occurs at least three times a week for longer than three months. What are the causes? Insomnia may be caused by another condition, situation, or substance, such as:  Anxiety.  Certain medicines.  Gastroesophageal reflux disease (GERD) or other gastrointestinal conditions.  Asthma or other breathing conditions.  Restless legs syndrome, sleep apnea, or other sleep disorders.  Chronic pain.  Menopause. This may include hot flashes.  Stroke.  Abuse of alcohol, tobacco, or illegal drugs.  Depression.  Caffeine.  Neurological disorders, such as Alzheimer disease.  An overactive thyroid (hyperthyroidism).  The cause of insomnia may not be known. What increases the risk? Risk factors for insomnia include:  Gender. Women are more commonly affected than men.  Age. Insomnia is more common as you get older.  Stress. This may  involve your professional or personal life.  Income. Insomnia is more common in people with lower income.  Lack of exercise.  Irregular work schedule or night shifts.  Traveling between different time zones.  What are the signs or symptoms? If you have insomnia, trouble falling asleep or trouble staying asleep is the main symptom. This may lead to other symptoms, such as:  Feeling fatigued.  Feeling nervous about going to sleep.  Not feeling rested in the morning.  Having trouble concentrating.  Feeling irritable, anxious, or depressed.  How is this treated? Treatment for insomnia depends on the cause. If your insomnia is caused by an underlying condition, treatment will focus on addressing the condition. Treatment may also include:  Medicines to help you sleep.  Counseling or therapy.  Lifestyle adjustments.  Follow these instructions at home:  Take medicines only as directed by your health care provider.  Keep regular sleeping and waking hours. Avoid naps.  Keep a sleep diary to help you and your health care provider figure out what could be causing your insomnia. Include: ? When you sleep. ? When you wake up during the night. ? How well you sleep. ? How rested you feel the next day. ? Any side effects of medicines you are taking. ? What you eat and drink.  Make your bedroom a comfortable place where it is easy to fall asleep: ? Put up shades or special blackout curtains to block light from outside. ? Use a white noise machine to block noise. ? Keep the temperature cool.  Exercise regularly as directed by your  health care provider. Avoid exercising right before bedtime.  Use relaxation techniques to manage stress. Ask your health care provider to suggest some techniques that may work well for you. These may include: ? Breathing exercises. ? Routines to release muscle tension. ? Visualizing peaceful scenes.  Cut back on alcohol, caffeinated beverages, and  cigarettes, especially close to bedtime. These can disrupt your sleep.  Do not overeat or eat spicy foods right before bedtime. This can lead to digestive discomfort that can make it hard for you to sleep.  Limit screen use before bedtime. This includes: ? Watching TV. ? Using your smartphone, tablet, and computer.  Stick to a routine. This can help you fall asleep faster. Try to do a quiet activity, brush your teeth, and go to bed at the same time each night.  Get out of bed if you are still awake after 15 minutes of trying to sleep. Keep the lights down, but try reading or doing a quiet activity. When you feel sleepy, go back to bed.  Make sure that you drive carefully. Avoid driving if you feel very sleepy.  Keep all follow-up appointments as directed by your health care provider. This is important. Contact a health care provider if:  You are tired throughout the day or have trouble in your daily routine due to sleepiness.  You continue to have sleep problems or your sleep problems get worse. Get help right away if:  You have serious thoughts about hurting yourself or someone else. This information is not intended to replace advice given to you by your health care provider. Make sure you discuss any questions you have with your health care provider. Document Released: 01/23/2000 Document Revised: 06/27/2015 Document Reviewed: 10/26/2013 Elsevier Interactive Patient Education  2018 [place] Years, Female Preventive care refers to lifestyle choices and visits with your health care provider that can promote health and wellness. What does preventive care include?  A yearly physical exam. This is also called an annual well check.  Dental exams once or twice a year.  Routine eye exams. Ask your health care provider how often you should have your eyes checked.  Personal lifestyle choices, including: ? Daily care of your teeth and gums. ? Regular  physical activity. ? Eating a healthy diet. ? Avoiding tobacco and drug use. ? Limiting alcohol use. ? Practicing safe sex. ? Taking low-dose aspirin daily starting at age 103. ? Taking vitamin and mineral supplements as recommended by your health care provider. What happens during an annual well check? The services and screenings done by your health care provider during your annual well check will depend on your age, overall health, lifestyle risk factors, and family history of disease. Counseling Your health care provider may ask you questions about your:  Alcohol use.  Tobacco use.  Drug use.  Emotional well-being.  Home and relationship well-being.  Sexual activity.  Eating habits.  Work and work Statistician.  Method of birth control.  Menstrual cycle.  Pregnancy history.  Screening You may have the following tests or measurements:  Height, weight, and BMI.  Blood pressure.  Lipid and cholesterol levels. These may be checked every 5 years, or more frequently if you are over 52 years old.  Skin check.  Lung cancer screening. You may have this screening every year starting at age 22 if you have a 30-pack-year history of smoking and currently smoke or have quit within the past 15 years.  Fecal occult blood  test (FOBT) of the stool. You may have this test every year starting at age 12.  Flexible sigmoidoscopy or colonoscopy. You may have a sigmoidoscopy every 5 years or a colonoscopy every 10 years starting at age 42.  Hepatitis C blood test.  Hepatitis B blood test.  Sexually transmitted disease (STD) testing.  Diabetes screening. This is done by checking your blood sugar (glucose) after you have not eaten for a while (fasting). You may have this done every 1-3 years.  Mammogram. This may be done every 1-2 years. Talk to your health care provider about when you should start having regular mammograms. This may depend on whether you have a family history of  breast cancer.  BRCA-related cancer screening. This may be done if you have a family history of breast, ovarian, tubal, or peritoneal cancers.  Pelvic exam and Pap test. This may be done every 3 years starting at age 60. Starting at age 63, this may be done every 5 years if you have a Pap test in combination with an HPV test.  Bone density scan. This is done to screen for osteoporosis. You may have this scan if you are at high risk for osteoporosis.  Discuss your test results, treatment options, and if necessary, the need for more tests with your health care provider. Vaccines Your health care provider may recommend certain vaccines, such as:  Influenza vaccine. This is recommended every year.  Tetanus, diphtheria, and acellular pertussis (Tdap, Td) vaccine. You may need a Td booster every 10 years.  Varicella vaccine. You may need this if you have not been vaccinated.  Zoster vaccine. You may need this after age 38.  Measles, mumps, and rubella (MMR) vaccine. You may need at least one dose of MMR if you were born in 1957 or later. You may also need a second dose.  Pneumococcal 13-valent conjugate (PCV13) vaccine. You may need this if you have certain conditions and were not previously vaccinated.  Pneumococcal polysaccharide (PPSV23) vaccine. You may need one or two doses if you smoke cigarettes or if you have certain conditions.  Meningococcal vaccine. You may need this if you have certain conditions.  Hepatitis A vaccine. You may need this if you have certain conditions or if you travel or work in places where you may be exposed to hepatitis A.  Hepatitis B vaccine. You may need this if you have certain conditions or if you travel or work in places where you may be exposed to hepatitis B.  Haemophilus influenzae type b (Hib) vaccine. You may need this if you have certain conditions.  Talk to your health care provider about which screenings and vaccines you need and how often you  need them. This information is not intended to replace advice given to you by your health care provider. Make sure you discuss any questions you have with your health care provider. Document Released: 02/21/2015 Document Revised: 10/15/2015 Document Reviewed: 11/26/2014 Elsevier Interactive Patient Education  2017 [employer].

## 2016-10-29 ENCOUNTER — Encounter: Payer: Self-pay | Admitting: Internal Medicine

## 2016-11-09 ENCOUNTER — Other Ambulatory Visit: Payer: BLUE CROSS/BLUE SHIELD

## 2016-11-16 ENCOUNTER — Other Ambulatory Visit: Payer: BLUE CROSS/BLUE SHIELD

## 2016-12-07 ENCOUNTER — Other Ambulatory Visit: Payer: BLUE CROSS/BLUE SHIELD

## 2016-12-14 ENCOUNTER — Other Ambulatory Visit: Payer: BLUE CROSS/BLUE SHIELD

## 2016-12-24 ENCOUNTER — Other Ambulatory Visit (INDEPENDENT_AMBULATORY_CARE_PROVIDER_SITE_OTHER): Payer: BLUE CROSS/BLUE SHIELD

## 2016-12-24 DIAGNOSIS — R7301 Impaired fasting glucose: Secondary | ICD-10-CM

## 2016-12-24 DIAGNOSIS — Z0000 Encounter for general adult medical examination without abnormal findings: Secondary | ICD-10-CM | POA: Diagnosis not present

## 2016-12-24 DIAGNOSIS — E782 Mixed hyperlipidemia: Secondary | ICD-10-CM

## 2016-12-24 LAB — BASIC METABOLIC PANEL
BUN: 21 mg/dL (ref 6–23)
CALCIUM: 9.3 mg/dL (ref 8.4–10.5)
CO2: 28 mEq/L (ref 19–32)
CREATININE: 0.97 mg/dL (ref 0.40–1.20)
Chloride: 104 mEq/L (ref 96–112)
GFR: 61.79 mL/min (ref >60.00)
Glucose, Bld: 80 mg/dL (ref 70–99)
Potassium: 4.2 mEq/L (ref 3.5–5.1)
Sodium: 139 mEq/L (ref 135–145)

## 2016-12-24 LAB — CBC WITH DIFFERENTIAL/PLATELET
BASOS PCT: 2.5 % (ref 0.0–3.0)
Basophils Absolute: 0.1 10*3/uL (ref 0.0–0.1)
Eosinophils Absolute: 0.4 10*3/uL (ref 0.0–0.7)
Eosinophils Relative: 8.9 % — ABNORMAL HIGH (ref 0.0–5.0)
HEMATOCRIT: 40.6 % (ref 36.0–46.0)
Hemoglobin: 13.7 g/dL (ref 12.0–15.0)
LYMPHS ABS: 1.5 10*3/uL (ref 0.7–4.0)
LYMPHS PCT: 33.6 % (ref 12.0–46.0)
MCHC: 33.7 g/dL (ref 30.0–36.0)
MCV: 96 fl (ref 78.0–100.0)
MONOS PCT: 6.5 % (ref 3.0–12.0)
Monocytes Absolute: 0.3 10*3/uL (ref 0.1–1.0)
NEUTROS ABS: 2.1 10*3/uL (ref 1.4–7.7)
Neutrophils Relative %: 48.5 % (ref 43.0–77.0)
PLATELETS: 265 10*3/uL (ref 150.0–400.0)
RBC: 4.23 Mil/uL (ref 3.87–5.11)
RDW: 12.4 % (ref 11.5–15.5)
WBC: 4.3 10*3/uL (ref 4.0–10.5)

## 2016-12-24 LAB — HEPATIC FUNCTION PANEL
ALK PHOS: 60 U/L (ref 39–117)
ALT: 17 U/L (ref 0–35)
AST: 23 U/L (ref 0–37)
Albumin: 4.2 g/dL (ref 3.5–5.2)
BILIRUBIN DIRECT: 0.1 mg/dL (ref 0.0–0.3)
TOTAL PROTEIN: 6.5 g/dL (ref 6.0–8.3)
Total Bilirubin: 0.5 mg/dL (ref 0.2–1.2)

## 2016-12-24 LAB — LIPID PANEL
CHOL/HDL RATIO: 3
Cholesterol: 262 mg/dL — ABNORMAL HIGH (ref 0–200)
HDL: 83.7 mg/dL (ref >39.00)
LDL Cholesterol: 167 mg/dL — ABNORMAL HIGH (ref 0–99)
NONHDL: 178.47
TRIGLYCERIDES: 59 mg/dL (ref 0.0–149.0)
VLDL: 11.8 mg/dL (ref 0.0–40.0)

## 2016-12-24 LAB — TSH: TSH: 2.64 u[IU]/mL (ref 0.35–4.50)

## 2016-12-24 LAB — HEMOGLOBIN A1C: Hgb A1c MFr Bld: 5.7 % (ref 4.6–6.5)

## 2017-11-04 ENCOUNTER — Encounter: Payer: BLUE CROSS/BLUE SHIELD | Admitting: Internal Medicine

## 2017-11-30 NOTE — Progress Notes (Signed)
Chief Complaint  Patient presents with  . Annual Exam    fasting. No new concerns    HPI: Patient  Lynn Griffin  62 y.o. comes in today for Preventive Health Care visit   Concern about thyroid as sis has had overactive but on meds no and has some inc flightiness  And concentration memory issues   Nothing alarming  Not sure she had varicella and has concern   Health Maintenance  Topic Date Due  . Hepatitis C Screening  02-Oct-1955  . HIV Screening  09/16/1969  . COLONOSCOPY  10/29/2015  . INFLUENZA VACCINE  05/10/2018 (Originally 09/08/2017)  . MAMMOGRAM  04/05/2018  . PAP SMEAR  10/15/2018  . TETANUS/TDAP  09/03/2024   Health Maintenance Review LIFESTYLE:  Exercise:   5 x per week.  Knee surgery  Tobacco/ETS:no Alcohol: varies  Varies  Less than 7  Sugar b  Everages: no Sleep:  1030- 630 varied   Getting better  Less flushes  Drug use: no   HH of    2     No pets  Work:  [employer]  Vit d probiotic  Digestive .  Wants to do cologuard .  No fam hx  mammotgram  Not recent  Will check   ROS:  Says she had low temp 97 and was cold could this be thyroid disease GEN/ HEENT: No fever, significant weight changes sweats headaches vision problems hearing changes, CV/ PULM; No chest pain shortness of breath cough, syncope,edema  change in exercise tolerance. GI /GU: No adominal pain, vomiting, change in bowel habits. No blood in the stool. No significant GU symptoms. SKIN/HEME: ,no acute skin rashes suspicious lesions or bleeding. No lymphadenopathy, nodules, masses.  NEURO/ PSYCH:  No neurologic signs such as weakness numbness. No depression anxiety. IMM/ Allergy: No unusual infections.  Allergy .   REST of 12 system review negative except as per HPI   Past Medical History:  Diagnosis Date  . Breast cyst   . Desensitization to allergy shot    hx of as a child   . Exercise-induced asthma   . Rectal bleeding 07/09/2010   Probably was beet juice    . Treadmill stress test  negative for angina pectoris    neg myoview 2008    Past Surgical History:  Procedure Laterality Date  . childbirth x 2    . hx allergy shots    . left knee reconstruction  1989  . myoview stess     negative 2008    Family History  Problem Relation Age of Onset  . Anemia Mother   . Parkinsonism Mother 45  . Coronary artery disease Father 35  . Ovarian cancer Sister   . Healthy Brother   . Healthy Sister   . Healthy Sister     Social History   Socioeconomic History  . Marital status: Married    Spouse name: Not on file  . Number of children: Not on file  . Years of education: Not on file  . Highest education level: Not on file  Occupational History  . Not on file  Social Needs  . Financial resource strain: Not on file  . Food insecurity:    Worry: Not on file    Inability: Not on file  . Transportation needs:    Medical: Not on file    Non-medical: Not on file  Tobacco Use  . Smoking status: Former Research scientist (life sciences)  . Smokeless tobacco: Never Used  Substance  and Sexual Activity  . Alcohol use: Yes    Comment: occasionally   . Drug use: No  . Sexual activity: Never    Birth control/protection: Post-menopausal  Lifestyle  . Physical activity:    Days per week: Not on file    Minutes per session: Not on file  . Stress: Not on file  Relationships  . Social connections:    Talks on phone: Not on file    Gets together: Not on file    Attends religious service: Not on file    Active member of club or organization: Not on file    Attends meetings of clubs or organizations: Not on file    Relationship status: Not on file  Other Topics Concern  . Not on file  Social History Narrative   Married   Former smoker   Regular Exercise- yes   hh of 2     1 cats   Child biirth x 2           Outpatient Medications Prior to Visit  Medication Sig Dispense Refill  . DIGESTIVE ENZYMES PO Take by mouth.    . Multiple Vitamins-Minerals (MULTIVITAMIN PO) Take 1 tablet by  mouth daily.    . Omega-3 Fatty Acids (OMEGA 3 PO) Take by mouth.    Marland Kitchen OVER THE COUNTER MEDICATION Paranorm (for parasites)    . OVER THE COUNTER MEDICATION Transitions (herbal supplement for menopause)     No facility-administered medications prior to visit.      EXAM:  BP 100/70 (BP Location: Right Arm, Patient Position: Sitting, Cuff Size: Normal)   Pulse 71   Temp 97.8 F (36.6 C) (Oral)   Ht 5' 6.25" (1.683 m)   Wt 124 lb 3.2 oz (56.3 kg)   LMP 02/08/2010   BMI 19.90 kg/m   Body mass index is 19.9 kg/m. Wt Readings from Last 3 Encounters:  12/02/17 124 lb 3.2 oz (56.3 kg)  10/19/16 125 lb 3.2 oz (56.8 kg)  05/31/16 128 lb 4.8 oz (58.2 kg)    Physical Exam: Vital signs reviewed YJE:HUDJ is a well-developed well-nourished alert cooperative    who appearsr stated age in no acute distress.  HEENT: normocephalic atraumatic , Eyes: PERRL EOM's full, conjunctiva clear, Nares: paten,t no deformity discharge or tenderness., Ears: no deformity EAC's clear TMs with normal landmarks. Mouth: clear OP, no lesions, edema.  Moist mucous membranes. Dentition in adequate repair. NECK: supple without masses, thyromegaly or bruits. CHEST/PULM:  Clear to auscultation and percussion breath sounds equal no wheeze , rales or rhonchi. No chest wall deformities or tenderness. Breast: normal by inspection . No dimpling, discharge, masses, tenderness or discharge . CV: PMI is nondisplaced, S1 S2 no gallops, murmurs, rubs. Peripheral pulses are full without delay.No JVD .  ABDOMEN: Bowel sounds normal nontender  No guard or rebound, no hepato splenomegal no CVA tenderness.  No hernia. Extremtities:  No clubbing cyanosis or edema, no acute joint swelling or redness no focal atrophy NEURO:  Oriented x3, cranial nerves 3-12 appear to be intact, no obvious focal weakness,gait within normal limits no abnormal reflexes or asymmetrical SKIN: No acute rashes normal turgor, color, no bruising or  petechiae. PSYCH: Oriented, good eye contact, no obvious depression anxiety, cognition and judgment appear normal. LN: no cervical axillary inguinal adenopathy  Lab Results  Component Value Date   WBC 4.3 12/24/2016   HGB 13.7 12/24/2016   HCT 40.6 12/24/2016   PLT 265.0 12/24/2016   GLUCOSE 80 12/24/2016  CHOL 262 (H) 12/24/2016   TRIG 59.0 12/24/2016   HDL 83.70 12/24/2016   LDLDIRECT 185.9 08/15/2012   LDLCALC 167 (H) 12/24/2016   ALT 17 12/24/2016   AST 23 12/24/2016   NA 139 12/24/2016   K 4.2 12/24/2016   CL 104 12/24/2016   CREATININE 0.97 12/24/2016   BUN 21 12/24/2016   CO2 28 12/24/2016   TSH 2.64 12/24/2016   HGBA1C 5.7 12/24/2016    BP Readings from Last 3 Encounters:  12/02/17 100/70  10/19/16 100/60  05/31/16 100/70      ASSESSMENT AND PLAN:  Discussed the following assessment and plan:  Visit for preventive health examination - Plan: Basic metabolic panel, CBC with Differential/Platelet, Hepatic function panel, Lipid panel, TSH, T4, free, T3, free, Thyroid antibodies  Medication management - Plan: Basic metabolic panel, CBC with Differential/Platelet, Hepatic function panel, Lipid panel, TSH, T4, free, T3, free, Thyroid antibodies  HYPERLIPIDEMIA - Plan: Basic metabolic panel, CBC with Differential/Platelet, Hepatic function panel, Lipid panel, TSH, T4, free, T3, free, Thyroid antibodies  Other fatigue - Plan: Basic metabolic panel, CBC with Differential/Platelet, Hepatic function panel, Lipid panel, TSH, T4, free, T3, free, Thyroid antibodies  Family history of thyroid disease in sister - Plan: Basic metabolic panel, CBC with Differential/Platelet, Hepatic function panel, Lipid panel, TSH, T4, free, T3, free, Thyroid antibodies  Immunity status testing - Plan: Varicella zoster antibody, IgG Has declined  Screening in past  Disc  mammo and colon screening  Risk benefit.   Counseled. About st memory  And post mesopause but  Alarm sx then fu  Will do  more complete thryoid fx    Expectant management. seh prob is immuni to varicella but had concern so will check status today  Patient Care Team: Niesha Bame, Standley Brooking, MD as PCP - Patsi Sears, MD as Attending Physician (Dermatology) Janne Lab, MD (Obstetrics and Gynecology) Patient Instructions   Will notify you  of labs when available.   Focusing  To help memory  Fu ir  persistent or progressive concernes  Advise mamnmogram  And  colon cancer  screening   Preventive Care 40-64 Years, Female Preventive care refers to lifestyle choices and visits with your health care provider that can promote health and wellness. What does preventive care include?  A yearly physical exam. This is also called an annual well check.  Dental exams once or twice a year.  Routine eye exams. Ask your health care provider how often you should have your eyes checked.  Personal lifestyle choices, including: ? Daily care of your teeth and gums. ? Regular physical activity. ? Eating a healthy diet. ? Avoiding tobacco and drug use. ? Limiting alcohol use. ? Practicing safe sex. ? Taking low-dose aspirin daily starting at age 20. ? Taking vitamin and mineral supplements as recommended by your health care provider. What happens during an annual well check? The services and screenings done by your health care provider during your annual well check will depend on your age, overall health, lifestyle risk factors, and family history of disease. Counseling Your health care provider may ask you questions about your:  Alcohol use.  Tobacco use.  Drug use.  Emotional well-being.  Home and relationship well-being.  Sexual activity.  Eating habits.  Work and work Statistician.  Method of birth control.  Menstrual cycle.  Pregnancy history.  Screening You may have the following tests or measurements:  Height, weight, and BMI.  Blood pressure.  Lipid and cholesterol levels. These may  be  checked every 5 years, or more frequently if you are over 84 years old.  Skin check.  Lung cancer screening. You may have this screening every year starting at age 44 if you have a 30-pack-year history of smoking and currently smoke or have quit within the past 15 years.  Fecal occult blood test (FOBT) of the stool. You may have this test every year starting at age 33.  Flexible sigmoidoscopy or colonoscopy. You may have a sigmoidoscopy every 5 years or a colonoscopy every 10 years starting at age 63.  Hepatitis C blood test.  Hepatitis B blood test.  Sexually transmitted disease (STD) testing.  Diabetes screening. This is done by checking your blood sugar (glucose) after you have not eaten for a while (fasting). You may have this done every 1-3 years.  Mammogram. This may be done every 1-2 years. Talk to your health care provider about when you should start having regular mammograms. This may depend on whether you have a family history of breast cancer.  BRCA-related cancer screening. This may be done if you have a family history of breast, ovarian, tubal, or peritoneal cancers.  Pelvic exam and Pap test. This may be done every 3 years starting at age 50. Starting at age 65, this may be done every 5 years if you have a Pap test in combination with an HPV test.  Bone density scan. This is done to screen for osteoporosis. You may have this scan if you are at high risk for osteoporosis.  Discuss your test results, treatment options, and if necessary, the need for more tests with your health care provider. Vaccines Your health care provider may recommend certain vaccines, such as:  Influenza vaccine. This is recommended every year.  Tetanus, diphtheria, and acellular pertussis (Tdap, Td) vaccine. You may need a Td booster every 10 years.  Varicella vaccine. You may need this if you have not been vaccinated.  Zoster vaccine. You may need this after age 38.  Measles, mumps, and  rubella (MMR) vaccine. You may need at least one dose of MMR if you were born in 1957 or later. You may also need a second dose.  Pneumococcal 13-valent conjugate (PCV13) vaccine. You may need this if you have certain conditions and were not previously vaccinated.  Pneumococcal polysaccharide (PPSV23) vaccine. You may need one or two doses if you smoke cigarettes or if you have certain conditions.  Meningococcal vaccine. You may need this if you have certain conditions.  Hepatitis A vaccine. You may need this if you have certain conditions or if you travel or work in places where you may be exposed to hepatitis A.  Hepatitis B vaccine. You may need this if you have certain conditions or if you travel or work in places where you may be exposed to hepatitis B.  Haemophilus influenzae type b (Hib) vaccine. You may need this if you have certain conditions.  Talk to your health care provider about which screenings and vaccines you need and how often you need them. This information is not intended to replace advice given to you by your health care provider. Make sure you discuss any questions you have with your health care provider. Document Released: 02/21/2015 Document Revised: 10/15/2015 Document Reviewed: 11/26/2014 Elsevier Interactive Patient Education  2018 [employer]. Zamauri Nez M.D.

## 2017-12-02 ENCOUNTER — Encounter: Payer: Self-pay | Admitting: Internal Medicine

## 2017-12-02 ENCOUNTER — Ambulatory Visit (INDEPENDENT_AMBULATORY_CARE_PROVIDER_SITE_OTHER): Payer: BLUE CROSS/BLUE SHIELD | Admitting: Internal Medicine

## 2017-12-02 VITALS — BP 100/70 | HR 71 | Temp 97.8°F | Ht 66.25 in | Wt 124.2 lb

## 2017-12-02 DIAGNOSIS — Z79899 Other long term (current) drug therapy: Secondary | ICD-10-CM

## 2017-12-02 DIAGNOSIS — R5383 Other fatigue: Secondary | ICD-10-CM

## 2017-12-02 DIAGNOSIS — Z0000 Encounter for general adult medical examination without abnormal findings: Secondary | ICD-10-CM

## 2017-12-02 DIAGNOSIS — E782 Mixed hyperlipidemia: Secondary | ICD-10-CM

## 2017-12-02 DIAGNOSIS — Z8349 Family history of other endocrine, nutritional and metabolic diseases: Secondary | ICD-10-CM

## 2017-12-02 DIAGNOSIS — Z0184 Encounter for antibody response examination: Secondary | ICD-10-CM

## 2017-12-02 LAB — CBC WITH DIFFERENTIAL/PLATELET
Basophils Absolute: 0.1 10*3/uL (ref 0.0–0.1)
Basophils Relative: 2.4 % (ref 0.0–3.0)
Eosinophils Absolute: 0.1 10*3/uL (ref 0.0–0.7)
Eosinophils Relative: 2.5 % (ref 0.0–5.0)
HCT: 41.8 % (ref 36.0–46.0)
HEMOGLOBIN: 14 g/dL (ref 12.0–15.0)
Lymphocytes Relative: 33.3 % (ref 12.0–46.0)
Lymphs Abs: 1.3 10*3/uL (ref 0.7–4.0)
MCHC: 33.6 g/dL (ref 30.0–36.0)
MCV: 95.9 fl (ref 78.0–100.0)
MONO ABS: 0.2 10*3/uL (ref 0.1–1.0)
Monocytes Relative: 6.3 % (ref 3.0–12.0)
NEUTROS PCT: 55.5 % (ref 43.0–77.0)
Neutro Abs: 2.1 10*3/uL (ref 1.4–7.7)
Platelets: 286 10*3/uL (ref 150.0–400.0)
RBC: 4.36 Mil/uL (ref 3.87–5.11)
RDW: 12.6 % (ref 11.5–15.5)
WBC: 3.8 10*3/uL — AB (ref 4.0–10.5)

## 2017-12-02 LAB — LIPID PANEL
CHOLESTEROL: 256 mg/dL — AB (ref 0–200)
HDL: 82.4 mg/dL (ref >39.00)
LDL Cholesterol: 162 mg/dL — ABNORMAL HIGH (ref 0–99)
NonHDL: 173.79
TRIGLYCERIDES: 60 mg/dL (ref 0.0–149.0)
Total CHOL/HDL Ratio: 3
VLDL: 12 mg/dL (ref 0.0–40.0)

## 2017-12-02 LAB — HEPATIC FUNCTION PANEL
ALT: 27 U/L (ref 0–35)
AST: 29 U/L (ref 0–37)
Albumin: 4.5 g/dL (ref 3.5–5.2)
Alkaline Phosphatase: 60 U/L (ref 39–117)
BILIRUBIN TOTAL: 0.5 mg/dL (ref 0.2–1.2)
Bilirubin, Direct: 0.1 mg/dL (ref 0.0–0.3)
TOTAL PROTEIN: 6.9 g/dL (ref 6.0–8.3)

## 2017-12-02 LAB — BASIC METABOLIC PANEL
BUN: 16 mg/dL (ref 6–23)
CALCIUM: 9.5 mg/dL (ref 8.4–10.5)
CO2: 26 mEq/L (ref 19–32)
Chloride: 104 mEq/L (ref 96–112)
Creatinine, Ser: 0.93 mg/dL (ref 0.40–1.20)
GFR: 64.67 mL/min (ref >60.00)
Glucose, Bld: 81 mg/dL (ref 70–99)
Potassium: 4.3 mEq/L (ref 3.5–5.1)
Sodium: 139 mEq/L (ref 135–145)

## 2017-12-02 LAB — T4, FREE: FREE T4: 0.68 ng/dL (ref 0.60–1.60)

## 2017-12-02 LAB — T3, FREE: T3 FREE: 2.8 pg/mL (ref 2.3–4.2)

## 2017-12-02 LAB — TSH: TSH: 2.42 u[IU]/mL (ref 0.35–4.50)

## 2017-12-02 NOTE — Patient Instructions (Signed)
Will notify you  of labs when available.   Focusing  To help memory  Fu ir  persistent or progressive concernes  Advise mamnmogram  And  colon cancer  screening   Preventive Care 40-64 Years, Female Preventive care refers to lifestyle choices and visits with your health care provider that can promote health and wellness. What does preventive care include?  A yearly physical exam. This is also called an annual well check.  Dental exams once or twice a year.  Routine eye exams. Ask your health care provider how often you should have your eyes checked.  Personal lifestyle choices, including: ? Daily care of your teeth and gums. ? Regular physical activity. ? Eating a healthy diet. ? Avoiding tobacco and drug use. ? Limiting alcohol use. ? Practicing safe sex. ? Taking low-dose aspirin daily starting at age 65. ? Taking vitamin and mineral supplements as recommended by your health care provider. What happens during an annual well check? The services and screenings done by your health care provider during your annual well check will depend on your age, overall health, lifestyle risk factors, and family history of disease. Counseling Your health care provider may ask you questions about your:  Alcohol use.  Tobacco use.  Drug use.  Emotional well-being.  Home and relationship well-being.  Sexual activity.  Eating habits.  Work and work Statistician.  Method of birth control.  Menstrual cycle.  Pregnancy history.  Screening You may have the following tests or measurements:  Height, weight, and BMI.  Blood pressure.  Lipid and cholesterol levels. These may be checked every 5 years, or more frequently if you are over 38 years old.  Skin check.  Lung cancer screening. You may have this screening every year starting at age 46 if you have a 30-pack-year history of smoking and currently smoke or have quit within the past 15 years.  Fecal occult blood test (FOBT)  of the stool. You may have this test every year starting at age 11.  Flexible sigmoidoscopy or colonoscopy. You may have a sigmoidoscopy every 5 years or a colonoscopy every 10 years starting at age 41.  Hepatitis C blood test.  Hepatitis B blood test.  Sexually transmitted disease (STD) testing.  Diabetes screening. This is done by checking your blood sugar (glucose) after you have not eaten for a while (fasting). You may have this done every 1-3 years.  Mammogram. This may be done every 1-2 years. Talk to your health care provider about when you should start having regular mammograms. This may depend on whether you have a family history of breast cancer.  BRCA-related cancer screening. This may be done if you have a family history of breast, ovarian, tubal, or peritoneal cancers.  Pelvic exam and Pap test. This may be done every 3 years starting at age 85. Starting at age 62, this may be done every 5 years if you have a Pap test in combination with an HPV test.  Bone density scan. This is done to screen for osteoporosis. You may have this scan if you are at high risk for osteoporosis.  Discuss your test results, treatment options, and if necessary, the need for more tests with your health care provider. Vaccines Your health care provider may recommend certain vaccines, such as:  Influenza vaccine. This is recommended every year.  Tetanus, diphtheria, and acellular pertussis (Tdap, Td) vaccine. You may need a Td booster every 10 years.  Varicella vaccine. You may need this if you  have not been vaccinated.  Zoster vaccine. You may need this after age 67.  Measles, mumps, and rubella (MMR) vaccine. You may need at least one dose of MMR if you were born in 1957 or later. You may also need a second dose.  Pneumococcal 13-valent conjugate (PCV13) vaccine. You may need this if you have certain conditions and were not previously vaccinated.  Pneumococcal polysaccharide (PPSV23) vaccine.  You may need one or two doses if you smoke cigarettes or if you have certain conditions.  Meningococcal vaccine. You may need this if you have certain conditions.  Hepatitis A vaccine. You may need this if you have certain conditions or if you travel or work in places where you may be exposed to hepatitis A.  Hepatitis B vaccine. You may need this if you have certain conditions or if you travel or work in places where you may be exposed to hepatitis B.  Haemophilus influenzae type b (Hib) vaccine. You may need this if you have certain conditions.  Talk to your health care provider about which screenings and vaccines you need and how often you need them. This information is not intended to replace advice given to you by your health care provider. Make sure you discuss any questions you have with your health care provider. Document Released: 02/21/2015 Document Revised: 10/15/2015 Document Reviewed: 11/26/2014 Elsevier Interactive Patient Education  [employer].

## 2017-12-05 LAB — THYROID ANTIBODIES
Thyroglobulin Ab: <1 IU/mL (ref <=1)
Thyroperoxidase Ab SerPl-aCnc: 4 IU/mL (ref <9)

## 2017-12-05 LAB — VARICELLA ZOSTER ANTIBODY, IGG: Varicella IgG: 394.9 index

## 2017-12-06 ENCOUNTER — Encounter: Payer: Self-pay | Admitting: *Deleted

## 2018-03-27 ENCOUNTER — Ambulatory Visit: Payer: Self-pay | Admitting: *Deleted

## 2018-03-27 NOTE — Telephone Encounter (Signed)
Summary: exposed to fle / requesting tamiflu   Patient calling and states that she was exposed to the flu yesterday and will be going out of town in the morning on vacation. Would like to know if Dr Regis Bill could send in tamiflu, for preventative measures, to her pharmacy? [facility] [place], [place] AT [address]

## 2018-03-27 NOTE — Telephone Encounter (Signed)
Do not advise  Prophylaxis   But   getting flu symptoms such as fever myalgias cough  . Could take tamiflu in the first 24 hours      Prefer evaluation  Contact here   First but since she is going out of town .   Can send in rx to have in case  But dont start unless gets flu  Sx .    If not sure be seen in urgent care   Can send in tamiflu 75 mg 1 po bid if needed for influenza  Disp 10  Would still advise vaccine    Takes 2 weeks to get protection

## 2018-03-27 NOTE — Telephone Encounter (Signed)
Pt calling to request a preventative prescription of Tamiflu. Pt was exposed to her son's girlfriend who was diagnosed with the flu. Pt does not have any symptoms currently. Pt has not received flu vaccine this year.Pt states she will be going out of town in the morning for 3 weeks. If prescription for Tamiflu is sent in pt is requesting for it to be sent to [facility] on [address].  Reason for Disposition . [1] Influenza EXPOSURE  (Close Contact) within last 7 days AND [2] exposed person is HIGH RISK (e.g., age > 59 years, pregnant, HIV+, chronic medical condition) AND [3] NO respiratory symptoms  Answer Assessment - Initial Assessment Questions 1. TYPE of EXPOSURE: "How were you exposed?" (e.g., close contact, not a close contact)     Son's girlfriend 2. DATE of EXPOSURE: "When did the exposure occur?" (e.g., hour, days, weeks)     Last night 3. HIGH RISK for COMPLICATIONS: "Do you have any heart or lung problems? Do you have a weakened immune system?" (e.g., CHF, COPD, asthma, HIV positive, chemotherapy, renal failure, diabetes mellitus, sickle cell anemia)  4. SYMPTOMS: "Do you have any symptoms?" (e.g., cough, fever, sore throat, difficulty breathing). No  Protocols used: INFLUENZA EXPOSURE-A-AH

## 2018-03-28 NOTE — Telephone Encounter (Signed)
Lvm for pt to call back. 

## 2018-09-05 NOTE — Telephone Encounter (Signed)
Ok to place order 

## 2018-09-05 NOTE — Telephone Encounter (Signed)
I'm ok with ordering it  dont know what insurance  Regulations are there.

## 2018-12-12 ENCOUNTER — Other Ambulatory Visit: Payer: Self-pay

## 2018-12-12 DIAGNOSIS — Z20822 Contact with and (suspected) exposure to covid-19: Secondary | ICD-10-CM

## 2018-12-13 LAB — NOVEL CORONAVIRUS, NAA: SARS-CoV-2, NAA: NOT DETECTED

## 2019-04-19 HISTORY — PX: MELANOMA EXCISION: SHX5266

## 2019-06-07 ENCOUNTER — Other Ambulatory Visit: Payer: Self-pay

## 2019-06-08 ENCOUNTER — Encounter: Payer: Self-pay | Admitting: Internal Medicine

## 2019-06-08 ENCOUNTER — Other Ambulatory Visit: Payer: Self-pay

## 2019-06-08 ENCOUNTER — Telehealth: Payer: Self-pay

## 2019-06-08 ENCOUNTER — Other Ambulatory Visit (HOSPITAL_COMMUNITY)
Admission: RE | Admit: 2019-06-08 | Discharge: 2019-06-08 | Disposition: A | Discharge status: Home / Self Care | Payer: 59 | Source: Ambulatory Visit | Attending: Internal Medicine | Admitting: Internal Medicine

## 2019-06-08 ENCOUNTER — Ambulatory Visit (INDEPENDENT_AMBULATORY_CARE_PROVIDER_SITE_OTHER): Payer: 59 | Admitting: Internal Medicine

## 2019-06-08 VITALS — BP 112/72 | HR 76 | Temp 97.7°F | Ht 66.5 in | Wt 124.4 lb

## 2019-06-08 DIAGNOSIS — Z8041 Family history of malignant neoplasm of ovary: Secondary | ICD-10-CM | POA: Insufficient documentation

## 2019-06-08 DIAGNOSIS — Z1151 Encounter for screening for human papillomavirus (HPV): Secondary | ICD-10-CM | POA: Diagnosis not present

## 2019-06-08 DIAGNOSIS — Z01419 Encounter for gynecological examination (general) (routine) without abnormal findings: Secondary | ICD-10-CM

## 2019-06-08 DIAGNOSIS — Z79899 Other long term (current) drug therapy: Secondary | ICD-10-CM

## 2019-06-08 DIAGNOSIS — E785 Hyperlipidemia, unspecified: Secondary | ICD-10-CM

## 2019-06-08 DIAGNOSIS — Z780 Asymptomatic menopausal state: Secondary | ICD-10-CM | POA: Insufficient documentation

## 2019-06-08 DIAGNOSIS — Z0000 Encounter for general adult medical examination without abnormal findings: Secondary | ICD-10-CM

## 2019-06-08 LAB — TSH: TSH: 2.77 u[IU]/mL (ref 0.35–4.50)

## 2019-06-08 LAB — CBC WITH DIFFERENTIAL/PLATELET
Basophils Absolute: 0.1 10*3/uL (ref 0.0–0.1)
Basophils Relative: 2.1 % (ref 0.0–3.0)
Eosinophils Absolute: 0.1 10*3/uL (ref 0.0–0.7)
Eosinophils Relative: 2.5 % (ref 0.0–5.0)
HCT: 40.3 % (ref 36.0–46.0)
Hemoglobin: 13.5 g/dL (ref 12.0–15.0)
Lymphocytes Relative: 29.3 % (ref 12.0–46.0)
Lymphs Abs: 1.1 10*3/uL (ref 0.7–4.0)
MCHC: 33.4 g/dL (ref 30.0–36.0)
MCV: 96.6 fl (ref 78.0–100.0)
Monocytes Absolute: 0.2 10*3/uL (ref 0.1–1.0)
Monocytes Relative: 6.7 % (ref 3.0–12.0)
Neutro Abs: 2.2 10*3/uL (ref 1.4–7.7)
Neutrophils Relative %: 59.4 % (ref 43.0–77.0)
Platelets: 249 10*3/uL (ref 150.0–400.0)
RBC: 4.17 Mil/uL (ref 3.87–5.11)
RDW: 13.3 % (ref 11.5–15.5)
WBC: 3.7 10*3/uL — ABNORMAL LOW (ref 4.0–10.5)

## 2019-06-08 LAB — LIPID PANEL
Cholesterol: 270 mg/dL — ABNORMAL HIGH (ref 0–200)
HDL: 81.6 mg/dL (ref >39.00)
LDL Cholesterol: 175 mg/dL — ABNORMAL HIGH (ref 0–99)
NonHDL: 188.21
Total CHOL/HDL Ratio: 3
Triglycerides: 66 mg/dL (ref 0.0–149.0)
VLDL: 13.2 mg/dL (ref 0.0–40.0)

## 2019-06-08 LAB — HEPATIC FUNCTION PANEL
ALT: 23 U/L (ref 0–35)
AST: 27 U/L (ref 0–37)
Albumin: 4.4 g/dL (ref 3.5–5.2)
Alkaline Phosphatase: 59 U/L (ref 39–117)
Bilirubin, Direct: 0.1 mg/dL (ref 0.0–0.3)
Total Bilirubin: 0.5 mg/dL (ref 0.2–1.2)
Total Protein: 6.7 g/dL (ref 6.0–8.3)

## 2019-06-08 LAB — BASIC METABOLIC PANEL
BUN: 16 mg/dL (ref 6–23)
CO2: 28 mEq/L (ref 19–32)
Calcium: 9.1 mg/dL (ref 8.4–10.5)
Chloride: 103 mEq/L (ref 96–112)
Creatinine, Ser: 1.01 mg/dL (ref 0.40–1.20)
GFR: 55.06 mL/min — ABNORMAL LOW (ref >60.00)
Glucose, Bld: 81 mg/dL (ref 70–99)
Potassium: 4.7 mEq/L (ref 3.5–5.1)
Sodium: 138 mEq/L (ref 135–145)

## 2019-06-08 NOTE — Progress Notes (Signed)
Chief Complaint  Patient presents with  . Annual Exam    Doing well    HPI: Patient  Lynn Griffin  64 y.o. comes in today for Preventive Health Care visit   Melanoma removed  RLE  Dr Danielle Dess pre dr Delman Cheadle  Sister died  In last year  Ovarian cancer age 44 Other neg fam hx cancer in primary people  parkingsons and father with cad   Health Maintenance  Topic Date Due  . Hepatitis C Screening  Never done  . HIV Screening  Never done  . COVID-19 Vaccine (1) Never done  . COLONOSCOPY  10/29/2015  . MAMMOGRAM  04/05/2018  . PAP SMEAR-Modifier  10/15/2018  . INFLUENZA VACCINE  09/09/2019  . TETANUS/TDAP  09/03/2024   Health Maintenance Review LIFESTYLE:  Exercise:    Not as much  Yoga   Tobacco/ETS:no Alcohol: 6 per week to less Sugar beverages:no Sleep: average  Off and on   Hydration an issue  Makers her self drink.  usig  Hydration drop.   Drug use: no HH of  2  No pets  Work: at home   3 per day  No gu sx  ROS:  GEN/ HEENT: No fever, significant weight changes sweats headaches vision problems hearing changes, CV/ PULM; No chest pain shortness of breath cough, syncope,edema  change in exercise tolerance. GI /GU: No adominal pain, vomiting, change in bowel habits. No blood in the stool. No significant GU symptoms. SKIN/HEME: ,no acute skin rashes suspicious lesions or bleeding. No lymphadenopathy, nodules, masses.  NEURO/ PSYCH:  No neurologic signs such as weakness numbness. No depression anxiety. IMM/ Allergy: No unusual infections.  Allergy .   REST of 12 system review negative except as per HPI   Past Medical History:  Diagnosis Date  . Breast cyst   . Desensitization to allergy shot    hx of as a child   . Exercise-induced asthma   . Rectal bleeding 07/09/2010   Probably was beet juice    . Treadmill stress test negative for angina pectoris    neg myoview 2008    Past Surgical History:  Procedure Laterality Date  . childbirth x 2    . hx allergy shots      . left knee reconstruction  1989  . MELANOMA EXCISION  04/19/2019  . myoview stess     negative 2008    Family History  Problem Relation Age of Onset  . Anemia Mother   . Parkinsonism Mother 28  . Coronary artery disease Father 35  . Ovarian cancer Sister   . Healthy Brother   . Healthy Sister   . Healthy Sister     Social History   Socioeconomic History  . Marital status: Married    Spouse name: Not on file  . Number of children: Not on file  . Years of education: Not on file  . Highest education level: Not on file  Occupational History  . Not on file  Tobacco Use  . Smoking status: Former Research scientist (life sciences)  . Smokeless tobacco: Never Used  Substance and Sexual Activity  . Alcohol use: Yes    Comment: occasionally   . Drug use: No  . Sexual activity: Never    Birth control/protection: Post-menopausal  Other Topics Concern  . Not on file  Social History Narrative   Married   Former smoker   Regular Exercise- yes   hh of 2     1 cats   Child  biirth x 2          Social Determinants of Radio broadcast assistant Strain:   . Difficulty of Paying Living Expenses:   Food Insecurity:   . Worried About Charity fundraiser in the Last Year:   . Arboriculturist in the Last Year:   Transportation Needs:   . Film/video editor (Medical):   Marland Kitchen Lack of Transportation (Non-Medical):   Physical Activity:   . Days of Exercise per Week:   . Minutes of Exercise per Session:   Stress:   . Feeling of Stress :   Social Connections:   . Frequency of Communication with Friends and Family:   . Frequency of Social Gatherings with Friends and Family:   . Attends Religious Services:   . Active Member of Clubs or Organizations:   . Attends Archivist Meetings:   Marland Kitchen Marital Status:     Outpatient Medications Prior to Visit  Medication Sig Dispense Refill  . ASHWAGANDHA PO Take 1 capsule by mouth daily. SeraLastin    . Calcium-Magnesium-Vitamin D (CALCIUM MAGNESIUM PO)  Take 2 capsules by mouth daily.    . Cholecalciferol 125 MCG (5000 UT) capsule Take 5,000 Units by mouth daily.    Marland Kitchen DIGESTIVE ENZYMES PO Take by mouth.    Marland Kitchen MILK THISTLE PO Take 1 capsule by mouth daily.    . Multiple Vitamins-Minerals (MULTIVITAMIN PO) Take 1 tablet by mouth daily.    . NON FORMULARY Takes Menopautonic at night    . Omega-3 Fatty Acids (OMEGA 3 PO) Take by mouth.    Marland Kitchen OVER THE COUNTER MEDICATION Paranorm (for parasites)    . OVER THE COUNTER MEDICATION Transitions (herbal supplement for menopause)    . Zinc 30 MG CAPS Take 1 capsule by mouth daily.     No facility-administered medications prior to visit.     EXAM:  BP 112/72   Pulse 76   Temp 97.7 F (36.5 C) (Temporal)   Ht 5' 6.5" (1.689 m)   Wt 124 lb 6.4 oz (56.4 kg)   LMP 02/08/2010   SpO2 98%   BMI 19.78 kg/m   Body mass index is 19.78 kg/m. Wt Readings from Last 3 Encounters:  06/08/19 124 lb 6.4 oz (56.4 kg)  12/02/17 124 lb 3.2 oz (56.3 kg)  10/19/16 125 lb 3.2 oz (56.8 kg)    Physical Exam: Vital signs reviewed [id number] is a well-developed well-nourished alert cooperative    who appearsr stated age in no acute distress.  HEENT: normocephalic atraumatic , Eyes: PERRL EOM's full, conjunctiva clear, ., Ears: no deformity EAC's clear TMs with normal landmarks. Mouth:masked  NECK: supple without masses, thyromegaly or bruits. CHEST/PULM:  Clear to auscultation and percussion breath sounds equal no wheeze , rales or rhonchi. No chest wall deformities or tenderness. Breast: normal by inspection . No dimpling, discharge, masses, tenderness or discharge . CV: PMI is nondisplaced, S1 S2 no gallops, murmurs, rubs. Peripheral pulses are full without delay.No JVD .  ABDOMEN: Bowel sounds normal nontender  No guard or rebound, no hepato splenomegal no CVA tenderness.  No hernia. Extremtities:  No clubbing cyanosis or edema, no acute joint swelling or redness no focal atrophy NEURO:  Oriented x3, cranial  nerves 3-12 appear to be intact, no obvious focal weakness,gait within normal limits no abnormal reflexes or asymmetrical SKIN: No acute rashes normal turgor, color, no bruising or petechiae. Well healed right  Le linear scar  PSYCH: Oriented,  good eye contact, no obvious depression anxiety, cognition and judgment appear normal. LN: no cervical axillary inguinal adenopathy Pelvic: NL ext GU, labia clear without lesions or rash . Vagina no lesions .Cervix: clear  No ectopy  UTERUS: Neg CMT Adnexa:  clear no masses . PAP done  Lab Results  Component Value Date   WBC 3.8 (L) 12/02/2017   HGB 14.0 12/02/2017   HCT 41.8 12/02/2017   PLT 286.0 12/02/2017   GLUCOSE 81 12/02/2017   CHOL 256 (H) 12/02/2017   TRIG 60.0 12/02/2017   HDL 82.40 12/02/2017   LDLDIRECT 185.9 08/15/2012   LDLCALC 162 (H) 12/02/2017   ALT 27 12/02/2017   AST 29 12/02/2017   NA 139 12/02/2017   K 4.3 12/02/2017   CL 104 12/02/2017   CREATININE 0.93 12/02/2017   BUN 16 12/02/2017   CO2 26 12/02/2017   TSH 2.42 12/02/2017   HGBA1C 5.7 12/24/2016    BP Readings from Last 3 Encounters:  06/08/19 112/72  12/02/17 100/70  10/19/16 100/60    Lab plan reviewed with patient   ASSESSMENT AND PLAN:  Discussed the following assessment and plan:    ICD-10-CM   1. Visit for preventive health examination  123456 Basic metabolic panel    CBC with Differential/Platelet    Hepatic function panel    Lipid panel    TSH  2. Medication management  123456 Basic metabolic panel    CBC with Differential/Platelet    Hepatic function panel    Lipid panel    TSH  3. Encounter for gynecological examination without abnormal finding  [id number] Basic metabolic panel    CBC with Differential/Platelet    Hepatic function panel    Lipid panel    TSH  4. Hyperlipidemia, unspecified hyperlipidemia type  99991111 Basic metabolic panel    CBC with Differential/Platelet    Hepatic function panel    Lipid panel    TSH  5. Family  history of ovarian carcinoma  Z80.41    sister passed age 80  neg fam hx breast colon cancer    Return in about 1 year (around 06/07/2020) for preventive /cpx. Mammogram  Cologuard   Patient Care Team: Marbin Olshefski, Standley Brooking, MD as PCP - Patsi Sears, MD as Attending Physician (Dermatology) Janne Lab, MD (Obstetrics and Gynecology) Patient Instructions     Will notify you  of labs when available.  Your exam is normal  Will get cologuard ordered  Get your mammogram    Health Maintenance, Female Adopting a healthy lifestyle and getting preventive care are important in promoting health and wellness. Ask your health care provider about:  The right schedule for you to have regular tests and exams.  Things you can do on your own to prevent diseases and keep yourself healthy. What should I know about diet, weight, and exercise? Eat a healthy diet   Eat a diet that includes plenty of vegetables, fruits, low-fat dairy products, and lean protein.  Do not eat a lot of foods that are high in solid fats, added sugars, or sodium. Maintain a healthy weight Body mass index (BMI) is used to identify weight problems. It estimates body fat based on height and weight. Your health care provider can help determine your BMI and help you achieve or maintain a healthy weight. Get regular exercise Get regular exercise. This is one of the most important things you can do for your health. Most adults should:  Exercise for at least 150 minutes each  week. The exercise should increase your heart rate and make you sweat (moderate-intensity exercise).  Do strengthening exercises at least twice a week. This is in addition to the moderate-intensity exercise.  Spend less time sitting. Even light physical activity can be beneficial. Watch cholesterol and blood lipids Have your blood tested for lipids and cholesterol at 65 years of age, then have this test every 5 years. Have your cholesterol levels  checked more often if:  Your lipid or cholesterol levels are high.  You are older than 65 years of age.  You are at high risk for heart disease. What should I know about cancer screening? Depending on your health history and family history, you may need to have cancer screening at various ages. This may include screening for:  Breast cancer.  Cervical cancer.  Colorectal cancer.  Skin cancer.  Lung cancer. What should I know about heart disease, diabetes, and high blood pressure? Blood pressure and heart disease  High blood pressure causes heart disease and increases the risk of stroke. This is more likely to develop in people who have high blood pressure readings, are of African descent, or are overweight.  Have your blood pressure checked: ? Every 3-5 years if you are 86-60 years of age. ? Every year if you are 31 years old or older. Diabetes Have regular diabetes screenings. This checks your fasting blood sugar level. Have the screening done:  Once every three years after age 49 if you are at a normal weight and have a low risk for diabetes.  More often and at a younger age if you are overweight or have a high risk for diabetes. What should I know about preventing infection? Hepatitis B If you have a higher risk for hepatitis B, you should be screened for this virus. Talk with your health care provider to find out if you are at risk for hepatitis B infection. Hepatitis C Testing is recommended for:  Everyone born from [birth year] through 1966.  Anyone with known risk factors for hepatitis C. Sexually transmitted infections (STIs)  Get screened for STIs, including gonorrhea and chlamydia, if: ? You are sexually active and are younger than 65 years of age. ? You are older than 65 years of age and your health care provider tells you that you are at risk for this type of infection. ? Your sexual activity has changed since you were last screened, and you are at increased risk  for chlamydia or gonorrhea. Ask your health care provider if you are at risk.  Ask your health care provider about whether you are at high risk for HIV. Your health care provider may recommend a prescription medicine to help prevent HIV infection. If you choose to take medicine to prevent HIV, you should first get tested for HIV. You should then be tested every 3 months for as long as you are taking the medicine. Pregnancy  If you are about to stop having your period (premenopausal) and you may become pregnant, seek counseling before you get pregnant.  Take 400 to 800 micrograms (mcg) of folic acid every day if you become pregnant.  Ask for birth control (contraception) if you want to prevent pregnancy. Osteoporosis and menopause Osteoporosis is a disease in which the bones lose minerals and strength with aging. This can result in bone fractures. If you are 18 years old or older, or if you are at risk for osteoporosis and fractures, ask your health care provider if you should:  Be screened for  bone loss.  Take a calcium or vitamin D supplement to lower your risk of fractures.  Be given hormone replacement therapy (HRT) to treat symptoms of menopause. Follow these instructions at home: Lifestyle  Do not use any products that contain nicotine or tobacco, such as cigarettes, e-cigarettes, and chewing tobacco. If you need help quitting, ask your health care provider.  Do not use street drugs.  Do not share needles.  Ask your health care provider for help if you need support or information about quitting drugs. Alcohol use  Do not drink alcohol if: ? Your health care provider tells you not to drink. ? You are pregnant, may be pregnant, or are planning to become pregnant.  If you drink alcohol: ? Limit how much you use to 0-1 drink a day. ? Limit intake if you are breastfeeding.  Be aware of how much alcohol is in your drink. In the [place] one drink equals one 12 oz bottle of beer (355  mL), one 5 oz glass of wine (148 mL), or one 1 oz glass of hard liquor (44 mL). General instructions  Schedule regular health, dental, and eye exams.  Stay current with your vaccines.  Tell your health care provider if: ? You often feel depressed. ? You have ever been abused or do not feel safe at home. Summary  Adopting a healthy lifestyle and getting preventive care are important in promoting health and wellness.  Follow your health care provider's instructions about healthy diet, exercising, and getting tested or screened for diseases.  Follow your health care provider's instructions on monitoring your cholesterol and blood pressure. This information is not intended to replace advice given to you by your health care provider. Make sure you discuss any questions you have with your health care provider. Document Revised: 01/18/2018 Document Reviewed: 01/18/2018 Elsevier Patient Education  2020 [place] Delphin Funes M.D.

## 2019-06-08 NOTE — Progress Notes (Signed)
Blood results stable  but cholesterol up from last year  Kidney function borderline again   Check yearly lab and  Intensify lifestyle interventions. but your risk  is still low  The 10-year ASCVD risk score Mikey Bussing DC Brooke Bonito., et al., 2013) is: 3.8%   Values used to calculate the score:     Age: 64 years     Sex: Female     Is Non-Hispanic African American: No     Diabetic: No     Tobacco smoker: No     Systolic Blood Pressure: [id number] mmHg     Is BP treated: No     HDL Cholesterol: 81.6 mg/dL     Total Cholesterol: 270 mg/dL

## 2019-06-08 NOTE — Telephone Encounter (Signed)
Faxed Cologuard request form to [phone] for patient.

## 2019-06-08 NOTE — Patient Instructions (Addendum)
Will notify you  of labs when available.  Your exam is normal  Will get cologuard ordered  Get your mammogram    Health Maintenance, Female Adopting a healthy lifestyle and getting preventive care are important in promoting health and wellness. Ask your health care provider about:  The right schedule for you to have regular tests and exams.  Things you can do on your own to prevent diseases and keep yourself healthy. What should I know about diet, weight, and exercise? Eat a healthy diet   Eat a diet that includes plenty of vegetables, fruits, low-fat dairy products, and lean protein.  Do not eat a lot of foods that are high in solid fats, added sugars, or sodium. Maintain a healthy weight Body mass index (BMI) is used to identify weight problems. It estimates body fat based on height and weight. Your health care provider can help determine your BMI and help you achieve or maintain a healthy weight. Get regular exercise Get regular exercise. This is one of the most important things you can do for your health. Most adults should:  Exercise for at least 150 minutes each week. The exercise should increase your heart rate and make you sweat (moderate-intensity exercise).  Do strengthening exercises at least twice a week. This is in addition to the moderate-intensity exercise.  Spend less time sitting. Even light physical activity can be beneficial. Watch cholesterol and blood lipids Have your blood tested for lipids and cholesterol at 65 years of age, then have this test every 5 years. Have your cholesterol levels checked more often if:  Your lipid or cholesterol levels are high.  You are older than 65 years of age.  You are at high risk for heart disease. What should I know about cancer screening? Depending on your health history and family history, you may need to have cancer screening at various ages. This may include screening for:  Breast cancer.  Cervical  cancer.  Colorectal cancer.  Skin cancer.  Lung cancer. What should I know about heart disease, diabetes, and high blood pressure? Blood pressure and heart disease  High blood pressure causes heart disease and increases the risk of stroke. This is more likely to develop in people who have high blood pressure readings, are of African descent, or are overweight.  Have your blood pressure checked: ? Every 3-5 years if you are 36-7 years of age. ? Every year if you are 61 years old or older. Diabetes Have regular diabetes screenings. This checks your fasting blood sugar level. Have the screening done:  Once every three years after age 62 if you are at a normal weight and have a low risk for diabetes.  More often and at a younger age if you are overweight or have a high risk for diabetes. What should I know about preventing infection? Hepatitis B If you have a higher risk for hepatitis B, you should be screened for this virus. Talk with your health care provider to find out if you are at risk for hepatitis B infection. Hepatitis C Testing is recommended for:  Everyone born from [birth year] through 1966.  Anyone with known risk factors for hepatitis C. Sexually transmitted infections (STIs)  Get screened for STIs, including gonorrhea and chlamydia, if: ? You are sexually active and are younger than 65 years of age. ? You are older than 65 years of age and your health care provider tells you that you are at risk for this type of infection. ?  Your sexual activity has changed since you were last screened, and you are at increased risk for chlamydia or gonorrhea. Ask your health care provider if you are at risk.  Ask your health care provider about whether you are at high risk for HIV. Your health care provider may recommend a prescription medicine to help prevent HIV infection. If you choose to take medicine to prevent HIV, you should first get tested for HIV. You should then be tested every 3  months for as long as you are taking the medicine. Pregnancy  If you are about to stop having your period (premenopausal) and you may become pregnant, seek counseling before you get pregnant.  Take 400 to 800 micrograms (mcg) of folic acid every day if you become pregnant.  Ask for birth control (contraception) if you want to prevent pregnancy. Osteoporosis and menopause Osteoporosis is a disease in which the bones lose minerals and strength with aging. This can result in bone fractures. If you are 30 years old or older, or if you are at risk for osteoporosis and fractures, ask your health care provider if you should:  Be screened for bone loss.  Take a calcium or vitamin D supplement to lower your risk of fractures.  Be given hormone replacement therapy (HRT) to treat symptoms of menopause. Follow these instructions at home: Lifestyle  Do not use any products that contain nicotine or tobacco, such as cigarettes, e-cigarettes, and chewing tobacco. If you need help quitting, ask your health care provider.  Do not use street drugs.  Do not share needles.  Ask your health care provider for help if you need support or information about quitting drugs. Alcohol use  Do not drink alcohol if: ? Your health care provider tells you not to drink. ? You are pregnant, may be pregnant, or are planning to become pregnant.  If you drink alcohol: ? Limit how much you use to 0-1 drink a day. ? Limit intake if you are breastfeeding.  Be aware of how much alcohol is in your drink. In the [place] one drink equals one 12 oz bottle of beer (355 mL), one 5 oz glass of wine (148 mL), or one 1 oz glass of hard liquor (44 mL). General instructions  Schedule regular health, dental, and eye exams.  Stay current with your vaccines.  Tell your health care provider if: ? You often feel depressed. ? You have ever been abused or do not feel safe at home. Summary  Adopting a healthy lifestyle and getting  preventive care are important in promoting health and wellness.  Follow your health care provider's instructions about healthy diet, exercising, and getting tested or screened for diseases.  Follow your health care provider's instructions on monitoring your cholesterol and blood pressure. This information is not intended to replace advice given to you by your health care provider. Make sure you discuss any questions you have with your health care provider. Document Revised: 01/18/2018 Document Reviewed: 01/18/2018 Elsevier Patient Education  2020 [employer].

## 2019-06-11 LAB — CYTOLOGY - PAP
Adequacy: ABSENT
Comment: NEGATIVE
Diagnosis: NEGATIVE
High risk HPV: NEGATIVE

## 2019-06-11 NOTE — Progress Notes (Signed)
Tell patient PAP is normal. HPV high  risk is negative

## 2019-06-26 LAB — COLOGUARD: Cologuard: NEGATIVE

## 2019-07-02 LAB — COLOGUARD: COLOGUARD: NEGATIVE

## 2019-07-30 ENCOUNTER — Telehealth: Payer: Self-pay

## 2019-07-30 NOTE — Telephone Encounter (Signed)
Patient has viewed negative Cologuard results.

## 2019-09-24 DIAGNOSIS — C44319 Basal cell carcinoma of skin of other parts of face: Secondary | ICD-10-CM | POA: Diagnosis not present

## 2019-10-03 DIAGNOSIS — Z86006 Personal history of melanoma in-situ: Secondary | ICD-10-CM | POA: Diagnosis not present

## 2019-10-03 DIAGNOSIS — D225 Melanocytic nevi of trunk: Secondary | ICD-10-CM | POA: Diagnosis not present

## 2019-10-03 DIAGNOSIS — D485 Neoplasm of uncertain behavior of skin: Secondary | ICD-10-CM | POA: Diagnosis not present

## 2019-10-03 DIAGNOSIS — Z808 Family history of malignant neoplasm of other organs or systems: Secondary | ICD-10-CM | POA: Diagnosis not present

## 2019-10-03 DIAGNOSIS — L570 Actinic keratosis: Secondary | ICD-10-CM | POA: Diagnosis not present

## 2019-10-03 DIAGNOSIS — D171 Benign lipomatous neoplasm of skin and subcutaneous tissue of trunk: Secondary | ICD-10-CM | POA: Diagnosis not present

## 2019-10-03 DIAGNOSIS — L821 Other seborrheic keratosis: Secondary | ICD-10-CM | POA: Diagnosis not present

## 2019-10-03 DIAGNOSIS — Z86018 Personal history of other benign neoplasm: Secondary | ICD-10-CM | POA: Diagnosis not present

## 2019-10-03 DIAGNOSIS — D224 Melanocytic nevi of scalp and neck: Secondary | ICD-10-CM | POA: Diagnosis not present

## 2019-10-03 DIAGNOSIS — L578 Other skin changes due to chronic exposure to nonionizing radiation: Secondary | ICD-10-CM | POA: Diagnosis not present

## 2019-11-20 ENCOUNTER — Other Ambulatory Visit: Payer: Self-pay | Admitting: Internal Medicine

## 2019-11-20 DIAGNOSIS — Z1231 Encounter for screening mammogram for malignant neoplasm of breast: Secondary | ICD-10-CM

## 2019-11-21 DIAGNOSIS — H1131 Conjunctival hemorrhage, right eye: Secondary | ICD-10-CM | POA: Diagnosis not present

## 2019-11-21 DIAGNOSIS — H2513 Age-related nuclear cataract, bilateral: Secondary | ICD-10-CM | POA: Diagnosis not present

## 2019-11-21 DIAGNOSIS — H524 Presbyopia: Secondary | ICD-10-CM | POA: Diagnosis not present

## 2019-12-17 ENCOUNTER — Ambulatory Visit
Admission: RE | Admit: 2019-12-17 | Discharge: 2019-12-17 | Disposition: A | Discharge status: Home / Self Care | Payer: PPO | Source: Ambulatory Visit | Attending: Internal Medicine | Admitting: Internal Medicine

## 2019-12-17 ENCOUNTER — Other Ambulatory Visit: Payer: Self-pay

## 2019-12-17 DIAGNOSIS — Z1231 Encounter for screening mammogram for malignant neoplasm of breast: Secondary | ICD-10-CM | POA: Diagnosis not present

## 2019-12-31 NOTE — Telephone Encounter (Signed)
Glad  You have recovered from the infection.   If  You had   A personal history of recovery from sars cov2   And you have been vaccinated already   I advise  wait until  6 mos or more after  covid infection   then decide at that time     ie ask again .and see what information we have   .  Basically  Infection is a booster  And  Probably gives more complete immunity than the  Injection and may last longer .   (However if you feel more comfortable with a booster   Wait at least 90 days  To avoid  Excess side effects of vaccine )

## 2020-05-08 DIAGNOSIS — D171 Benign lipomatous neoplasm of skin and subcutaneous tissue of trunk: Secondary | ICD-10-CM | POA: Diagnosis not present

## 2020-05-08 DIAGNOSIS — L578 Other skin changes due to chronic exposure to nonionizing radiation: Secondary | ICD-10-CM | POA: Diagnosis not present

## 2020-05-08 DIAGNOSIS — L821 Other seborrheic keratosis: Secondary | ICD-10-CM | POA: Diagnosis not present

## 2020-05-08 DIAGNOSIS — Z86006 Personal history of melanoma in-situ: Secondary | ICD-10-CM | POA: Diagnosis not present

## 2020-05-08 DIAGNOSIS — D224 Melanocytic nevi of scalp and neck: Secondary | ICD-10-CM | POA: Diagnosis not present

## 2020-05-08 DIAGNOSIS — Z808 Family history of malignant neoplasm of other organs or systems: Secondary | ICD-10-CM | POA: Diagnosis not present

## 2020-05-08 DIAGNOSIS — D225 Melanocytic nevi of trunk: Secondary | ICD-10-CM | POA: Diagnosis not present

## 2020-05-08 DIAGNOSIS — L570 Actinic keratosis: Secondary | ICD-10-CM | POA: Diagnosis not present

## 2020-05-08 DIAGNOSIS — Z86018 Personal history of other benign neoplasm: Secondary | ICD-10-CM | POA: Diagnosis not present

## 2020-06-09 ENCOUNTER — Other Ambulatory Visit: Payer: Self-pay

## 2020-06-09 ENCOUNTER — Ambulatory Visit (INDEPENDENT_AMBULATORY_CARE_PROVIDER_SITE_OTHER): Payer: PPO | Admitting: Internal Medicine

## 2020-06-09 ENCOUNTER — Encounter: Payer: Self-pay | Admitting: Internal Medicine

## 2020-06-09 VITALS — BP 100/64 | HR 62 | Temp 97.6°F | Ht 67.0 in | Wt 131.2 lb

## 2020-06-09 DIAGNOSIS — Z85820 Personal history of malignant melanoma of skin: Secondary | ICD-10-CM | POA: Diagnosis not present

## 2020-06-09 DIAGNOSIS — Z2821 Immunization not carried out because of patient refusal: Secondary | ICD-10-CM

## 2020-06-09 DIAGNOSIS — Z8041 Family history of malignant neoplasm of ovary: Secondary | ICD-10-CM | POA: Diagnosis not present

## 2020-06-09 DIAGNOSIS — E2839 Other primary ovarian failure: Secondary | ICD-10-CM

## 2020-06-09 DIAGNOSIS — E785 Hyperlipidemia, unspecified: Secondary | ICD-10-CM | POA: Diagnosis not present

## 2020-06-09 DIAGNOSIS — Z79899 Other long term (current) drug therapy: Secondary | ICD-10-CM | POA: Diagnosis not present

## 2020-06-09 DIAGNOSIS — N951 Menopausal and female climacteric states: Secondary | ICD-10-CM

## 2020-06-09 DIAGNOSIS — Z0000 Encounter for general adult medical examination without abnormal findings: Secondary | ICD-10-CM

## 2020-06-09 LAB — CBC WITH DIFFERENTIAL/PLATELET
Basophils Absolute: 0.1 10*3/uL (ref 0.0–0.1)
Basophils Relative: 1.2 % (ref 0.0–3.0)
Eosinophils Absolute: 0.2 10*3/uL (ref 0.0–0.7)
Eosinophils Relative: 2.8 % (ref 0.0–5.0)
HCT: 40.2 % (ref 36.0–46.0)
Hemoglobin: 13.5 g/dL (ref 12.0–15.0)
Lymphocytes Relative: 20.5 % (ref 12.0–46.0)
Lymphs Abs: 1.2 10*3/uL (ref 0.7–4.0)
MCHC: 33.6 g/dL (ref 30.0–36.0)
MCV: 95.5 fl (ref 78.0–100.0)
Monocytes Absolute: 0.3 10*3/uL (ref 0.1–1.0)
Monocytes Relative: 5.1 % (ref 3.0–12.0)
Neutro Abs: 4.1 10*3/uL (ref 1.4–7.7)
Neutrophils Relative %: 70.4 % (ref 43.0–77.0)
Platelets: 232 10*3/uL (ref 150.0–400.0)
RBC: 4.21 Mil/uL (ref 3.87–5.11)
RDW: 12.5 % (ref 11.5–15.5)
WBC: 5.8 10*3/uL (ref 4.0–10.5)

## 2020-06-09 LAB — LIPID PANEL
Cholesterol: 274 mg/dL — ABNORMAL HIGH (ref 0–200)
HDL: 80 mg/dL (ref >39.00)
LDL Cholesterol: 180 mg/dL — ABNORMAL HIGH (ref 0–99)
NonHDL: 193.74
Total CHOL/HDL Ratio: 3
Triglycerides: 68 mg/dL (ref 0.0–149.0)
VLDL: 13.6 mg/dL (ref 0.0–40.0)

## 2020-06-09 LAB — BASIC METABOLIC PANEL
BUN: 16 mg/dL (ref 6–23)
CO2: 25 mEq/L (ref 19–32)
Calcium: 9.5 mg/dL (ref 8.4–10.5)
Chloride: 104 mEq/L (ref 96–112)
Creatinine, Ser: 0.96 mg/dL (ref 0.40–1.20)
GFR: 61.99 mL/min (ref >60.00)
Glucose, Bld: 86 mg/dL (ref 70–99)
Potassium: 4.6 mEq/L (ref 3.5–5.1)
Sodium: 137 mEq/L (ref 135–145)

## 2020-06-09 LAB — HEPATIC FUNCTION PANEL
ALT: 24 U/L (ref 0–35)
AST: 32 U/L (ref 0–37)
Albumin: 4.5 g/dL (ref 3.5–5.2)
Alkaline Phosphatase: 55 U/L (ref 39–117)
Bilirubin, Direct: 0.1 mg/dL (ref 0.0–0.3)
Total Bilirubin: 0.5 mg/dL (ref 0.2–1.2)
Total Protein: 6.8 g/dL (ref 6.0–8.3)

## 2020-06-09 LAB — TSH: TSH: 3.23 u[IU]/mL (ref 0.35–4.50)

## 2020-06-09 NOTE — Patient Instructions (Signed)
Glad you are doing well.  Get appt for dexa bone density Will notify you  of labs when available.  Consider checking with gyne for update monitoring risk .  Considier advised prevnar 20  Get a smoke detector.     Health Maintenance, Female Adopting a healthy lifestyle and getting preventive care are important in promoting health and wellness. Ask your health care provider about:  The right schedule for you to have regular tests and exams.  Things you can do on your own to prevent diseases and keep yourself healthy. What should I know about diet, weight, and exercise? Eat a healthy diet  Eat a diet that includes plenty of vegetables, fruits, low-fat dairy products, and lean protein.  Do not eat a lot of foods that are high in solid fats, added sugars, or sodium.   Maintain a healthy weight Body mass index (BMI) is used to identify weight problems. It estimates body fat based on height and weight. Your health care provider can help determine your BMI and help you achieve or maintain a healthy weight. Get regular exercise Get regular exercise. This is one of the most important things you can do for your health. Most adults should:  Exercise for at least 150 minutes each week. The exercise should increase your heart rate and make you sweat (moderate-intensity exercise).  Do strengthening exercises at least twice a week. This is in addition to the moderate-intensity exercise.  Spend less time sitting. Even light physical activity can be beneficial. Watch cholesterol and blood lipids Have your blood tested for lipids and cholesterol at 67 years of age, then have this test every 5 years. Have your cholesterol levels checked more often if:  Your lipid or cholesterol levels are high.  You are older than 67 years of age.  You are at high risk for heart disease. What should I know about cancer screening? Depending on your health history and family history, you may need to have cancer  screening at various ages. This may include screening for:  Breast cancer.  Cervical cancer.  Colorectal cancer.  Skin cancer.  Lung cancer. What should I know about heart disease, diabetes, and high blood pressure? Blood pressure and heart disease  High blood pressure causes heart disease and increases the risk of stroke. This is more likely to develop in people who have high blood pressure readings, are of African descent, or are overweight.  Have your blood pressure checked: ? Every 3-5 years if you are 40-80 years of age. ? Every year if you are 67 years old or older. Diabetes Have regular diabetes screenings. This checks your fasting blood sugar level. Have the screening done:  Once every three years after age 97 if you are at a normal weight and have a low risk for diabetes.  More often and at a younger age if you are overweight or have a high risk for diabetes. What should I know about preventing infection? Hepatitis B If you have a higher risk for hepatitis B, you should be screened for this virus. Talk with your health care provider to find out if you are at risk for hepatitis B infection. Hepatitis C Testing is recommended for:  Everyone born from [birth year] through 1966.  Anyone with known risk factors for hepatitis C. Sexually transmitted infections (STIs)  Get screened for STIs, including gonorrhea and chlamydia, if: ? You are sexually active and are younger than 66 years of age. ? You are older than 66 years of  age and your health care provider tells you that you are at risk for this type of infection. ? Your sexual activity has changed since you were last screened, and you are at increased risk for chlamydia or gonorrhea. Ask your health care provider if you are at risk.  Ask your health care provider about whether you are at high risk for HIV. Your health care provider may recommend a prescription medicine to help prevent HIV infection. If you choose to take  medicine to prevent HIV, you should first get tested for HIV. You should then be tested every 3 months for as long as you are taking the medicine. Pregnancy  If you are about to stop having your period (premenopausal) and you may become pregnant, seek counseling before you get pregnant.  Take 400 to 800 micrograms (mcg) of folic acid every day if you become pregnant.  Ask for birth control (contraception) if you want to prevent pregnancy. Osteoporosis and menopause Osteoporosis is a disease in which the bones lose minerals and strength with aging. This can result in bone fractures. If you are 31 years old or older, or if you are at risk for osteoporosis and fractures, ask your health care provider if you should:  Be screened for bone loss.  Take a calcium or vitamin D supplement to lower your risk of fractures.  Be given hormone replacement therapy (HRT) to treat symptoms of menopause. Follow these instructions at home: Lifestyle  Do not use any products that contain nicotine or tobacco, such as cigarettes, e-cigarettes, and chewing tobacco. If you need help quitting, ask your health care provider.  Do not use street drugs.  Do not share needles.  Ask your health care provider for help if you need support or information about quitting drugs. Alcohol use  Do not drink alcohol if: ? Your health care provider tells you not to drink. ? You are pregnant, may be pregnant, or are planning to become pregnant.  If you drink alcohol: ? Limit how much you use to 0-1 drink a day. ? Limit intake if you are breastfeeding.  Be aware of how much alcohol is in your drink. In the [place] one drink equals one 12 oz bottle of beer (355 mL), one 5 oz glass of wine (148 mL), or one 1 oz glass of hard liquor (44 mL). General instructions  Schedule regular health, dental, and eye exams.  Stay current with your vaccines.  Tell your health care provider if: ? You often feel depressed. ? You have  ever been abused or do not feel safe at home. Summary  Adopting a healthy lifestyle and getting preventive care are important in promoting health and wellness.  Follow your health care provider's instructions about healthy diet, exercising, and getting tested or screened for diseases.  Follow your health care provider's instructions on monitoring your cholesterol and blood pressure. This information is not intended to replace advice given to you by your health care provider. Make sure you discuss any questions you have with your health care provider. Document Revised: 01/18/2018 Document Reviewed: 01/18/2018 Elsevier Patient Education  2021 [employer].

## 2020-06-09 NOTE — Progress Notes (Signed)
Chief Complaint  Patient presents with  . Annual Exam    HPI: Lynn Griffin 65 y.o. comes in today for Preventive Medicare exam/ wellness visit .Since last visit. Doing well  Eye check tiny cataract  hearing ok  Herbals and supplements   Updated   For menopausal sleep sx  Doing better after sis death  Neg fam hx osteoporosis  Known   Health Maintenance  Topic Date Due  . Hepatitis C Screening  Never done  . COLONOSCOPY (Pts 45-5yrs Insurance coverage will need to be confirmed)  10/29/2015  . DEXA SCAN  Never done  . PNA vac Low Risk Adult (1 of 2 - PCV13) Never done  . INFLUENZA VACCINE  09/08/2020  . MAMMOGRAM  12/16/2021  . PAP SMEAR-Modifier  06/08/2022  . TETANUS/TDAP  09/03/2024  . COVID-19 Vaccine  Completed  . HIV Screening  Completed  . HPV VACCINES  Aged Out   Health Maintenance Review LIFESTYLE:  Exercise:  y Tobacco/ETS:n Alcohol: not a lot  Sugar beverages: Sleep: not bad  Aims  For 8  Drug use: no HH: 2  No pets   Sister  Passes about 18 mos ago  Ovarian cancer greiving path.     Hearing:  Not interfering   Vision:  No limitations at present . Last eye check UTD  Safety:  Has smoke detector not currently working and wears seat belts.No excess sun exposure. Sees dentist regularly.  Falls: n  Advance directive :  Reviewed  Discussion counseling   Memory: Felt to be good   covid poss   auguat  Still taste and smell. , no concern from her or her family.  Depression: No anhedonia unusual crying or depressive symptoms  Nutrition: Eats well balanced diet; adequate calcium and vitamin D. No swallowing chewing problems.  Injury: no major injuries in the last six months.  Other healthcare providers:  Reviewed today .  Preventive parameters: up-to-date  Reviewed   ADLS:   There are no problems or need for assistance  driving, feeding, obtaining food, dressing, toileting and bathing, managing money using phone. She is independent.  ROS:  GEN/  HEENT: No fever, significant weight changes sweats headaches vision problems hearing changes, CV/ PULM; No chest pain shortness of breath cough, syncope,edema  change in exercise tolerance. GI /GU: No adominal pain, vomiting, change in bowel habits. No blood in the stool. No significant GU symptoms. SKIN/HEME: ,no acute skin rashes suspicious lesions or bleeding. No lymphadenopathy, nodules, masses.  NEURO/ PSYCH:  No neurologic signs such as weakness numbness. No depression anxiety. IMM/ Allergy: No unusual infections.  Allergy .   REST of 12 system review negative except as per HPI   Past Medical History:  Diagnosis Date  . Breast cyst   . Desensitization to allergy shot    hx of as a child   . Exercise-induced asthma   . Rectal bleeding 07/09/2010   Probably was beet juice    . Treadmill stress test negative for angina pectoris    neg myoview 2008    Family History  Problem Relation Age of Onset  . Anemia Mother   . Parkinsonism Mother 61  . Coronary artery disease Father 67  . Ovarian cancer Sister   . Healthy Brother   . Healthy Sister   . Healthy Sister     Social History   Socioeconomic History  . Marital status: Married    Spouse name: Not on file  . Number of children:  Not on file  . Years of education: Not on file  . Highest education level: Not on file  Occupational History  . Not on file  Tobacco Use  . Smoking status: Former Research scientist (life sciences)  . Smokeless tobacco: Never Used  Vaping Use  . Vaping Use: Never used  Substance and Sexual Activity  . Alcohol use: Yes    Comment: occasionally   . Drug use: No  . Sexual activity: Never    Birth control/protection: Post-menopausal  Other Topics Concern  . Not on file  Social History Narrative   Married   Former smoker   Regular Exercise- yes   hh of 2     1 cats   Child biirth x 2          Social Determinants of Health   Financial Resource Strain: Not on file  Food Insecurity: Not on file  Transportation  Needs: Not on file  Physical Activity: Not on file  Stress: Not on file  Social Connections: Not on file    Outpatient Encounter Medications as of 06/09/2020  Medication Sig  . ASHWAGANDHA PO Take 1 capsule by mouth daily. SeraLastin  . Calcium-Magnesium-Vitamin D (CALCIUM MAGNESIUM PO) Take 2 capsules by mouth daily.  . Cholecalciferol 125 MCG (5000 UT) capsule Take 5,000 Units by mouth daily.  Marland Kitchen DIGESTIVE ENZYMES PO Take by mouth.  . Multiple Vitamins-Minerals (MULTIVITAMIN PO) Take 1 tablet by mouth daily.  . NON FORMULARY Takes Menopautonic at night  . Omega-3 Fatty Acids (OMEGA 3 PO) Take by mouth.  Marland Kitchen OVER THE COUNTER MEDICATION Paranorm (for parasites)  . OVER THE COUNTER MEDICATION Transitions (herbal supplement for menopause)  . Zinc 30 MG CAPS Take 1 capsule by mouth daily.  Marland Kitchen MILK THISTLE PO Take 1 capsule by mouth daily. (Patient not taking: Reported on 06/09/2020)   No facility-administered encounter medications on file as of 06/09/2020.    EXAM:  BP 100/64 (BP Location: Left Arm, Patient Position: Sitting, Cuff Size: Normal)   Pulse 62   Temp 97.6 F (36.4 C) (Oral)   Ht 5\' 7"  (1.702 m)   Wt 131 lb 3.2 oz (59.5 kg)   LMP 02/08/2010   SpO2 99%   BMI 20.55 kg/m   Body mass index is 20.55 kg/m.  Physical Exam: Vital signs reviewed [id number] is a well-developed well-nourished alert cooperative   who appears stated age in no acute distress.  HEENT: normocephalic atraumatic , Eyes: PERRL EOM's full, conjunctiva clear, Nares: paten,t no deformity discharge or tenderness., Ears: no deformity EAC's clearr min wax in left  TMs with normal landmarks. Mouth: masked NECK: supple without masses, thyromegaly or bruits. CHEST/PULM:  Clear to auscultation and percussion breath sounds equal no wheeze , rales or rhonchi. No chest wall deformities or tenderness. CV: PMI is nondisplaced, S1 S2 no gallops, murmurs, rubs. Peripheral pulses are full without delay.No JVD .  Breast: normal  by inspection . No dimpling, discharge, masses, tenderness or discharge . ABDOMEN: Bowel sounds normal nontender  No guard or rebound, no hepato splenomegal no CVA tenderness.   Extremtities:  No clubbing cyanosis or edema, no acute joint swelling or redness no focal atrophy NEURO:  Oriented x3, cranial nerves 3-12 appear to be intact, no obvious focal weakness,gait within normal limits no abnormal reflexes or asymmetrical SKIN: No acute rashes normal turgor, color, no bruising or petechiae. PSYCH: Oriented, good eye contact, no obvious depression anxiety, cognition and judgment appear normal. LN: no cervical axillary inguinal adenopathy No noted  deficits in memory, attention, and speech.   Lab Results  Component Value Date   WBC 5.8 06/09/2020   HGB 13.5 06/09/2020   HCT 40.2 06/09/2020   PLT 232.0 06/09/2020   GLUCOSE 86 06/09/2020   CHOL 274 (H) 06/09/2020   TRIG 68.0 06/09/2020   HDL 80.00 06/09/2020   LDLDIRECT 185.9 08/15/2012   LDLCALC 180 (H) 06/09/2020   ALT 24 06/09/2020   AST 32 06/09/2020   NA 137 06/09/2020   K 4.6 06/09/2020   CL 104 06/09/2020   CREATININE 0.96 06/09/2020   BUN 16 06/09/2020   CO2 25 06/09/2020   TSH 3.23 06/09/2020   HGBA1C 5.7 12/24/2016    ASSESSMENT AND PLAN:  Discussed the following assessment and plan:  Visit for preventive health examination  Medication management - Plan: Basic metabolic panel, CBC with Differential/Platelet, Hepatic function panel, Lipid panel, TSH, TSH, Lipid panel, Hepatic function panel, CBC with Differential/Platelet, Basic metabolic panel  Hyperlipidemia, unspecified hyperlipidemia type - Plan: Basic metabolic panel, CBC with Differential/Platelet, Hepatic function panel, Lipid panel, TSH, TSH, Lipid panel, Hepatic function panel, CBC with Differential/Platelet, Basic metabolic panel  Estrogen deficiency - Plan: Basic metabolic panel, CBC with Differential/Platelet, Hepatic function panel, Lipid panel, TSH, DG  Bone Density, TSH, Lipid panel, Hepatic function panel, CBC with Differential/Platelet, Basic metabolic panel  Menopausal state - Plan: Basic metabolic panel, CBC with Differential/Platelet, Hepatic function panel, Lipid panel, TSH, DG Bone Density, TSH, Lipid panel, Hepatic function panel, CBC with Differential/Platelet, Basic metabolic panel  History of melanoma - Plan: Basic metabolic panel, CBC with Differential/Platelet, Hepatic function panel, Lipid panel, TSH, TSH, Lipid panel, Hepatic function panel, CBC with Differential/Platelet, Basic metabolic panel  Pneumococcal vaccination declined today  - will consider for later   Family history of ovarian carcinoma  Patient Care Team: Burnis Medin, MD as PCP - Patsi Sears, MD as Attending Physician (Dermatology) Janne Lab, MD (Obstetrics and Gynecology)  Patient Instructions   Glad you are doing well.  Get appt for dexa bone density Will notify you  of labs when available.  Consider checking with gyne for update monitoring risk .  Considier advised prevnar 20  Get a smoke detector.     Health Maintenance, Female Adopting a healthy lifestyle and getting preventive care are important in promoting health and wellness. Ask your health care provider about:  The right schedule for you to have regular tests and exams.  Things you can do on your own to prevent diseases and keep yourself healthy. What should I know about diet, weight, and exercise? Eat a healthy diet  Eat a diet that includes plenty of vegetables, fruits, low-fat dairy products, and lean protein.  Do not eat a lot of foods that are high in solid fats, added sugars, or sodium.   Maintain a healthy weight Body mass index (BMI) is used to identify weight problems. It estimates body fat based on height and weight. Your health care provider can help determine your BMI and help you achieve or maintain a healthy weight. Get regular exercise Get regular  exercise. This is one of the most important things you can do for your health. Most adults should:  Exercise for at least 150 minutes each week. The exercise should increase your heart rate and make you sweat (moderate-intensity exercise).  Do strengthening exercises at least twice a week. This is in addition to the moderate-intensity exercise.  Spend less time sitting. Even light physical activity can be beneficial. Watch cholesterol  and blood lipids Have your blood tested for lipids and cholesterol at 66 years of age, then have this test every 5 years. Have your cholesterol levels checked more often if:  Your lipid or cholesterol levels are high.  You are older than 66 years of age.  You are at high risk for heart disease. What should I know about cancer screening? Depending on your health history and family history, you may need to have cancer screening at various ages. This may include screening for:  Breast cancer.  Cervical cancer.  Colorectal cancer.  Skin cancer.  Lung cancer. What should I know about heart disease, diabetes, and high blood pressure? Blood pressure and heart disease  High blood pressure causes heart disease and increases the risk of stroke. This is more likely to develop in people who have high blood pressure readings, are of African descent, or are overweight.  Have your blood pressure checked: ? Every 3-5 years if you are 15-66 years of age. ? Every year if you are 66 years old or older. Diabetes Have regular diabetes screenings. This checks your fasting blood sugar level. Have the screening done:  Once every three years after age 24 if you are at a normal weight and have a low risk for diabetes.  More often and at a younger age if you are overweight or have a high risk for diabetes. What should I know about preventing infection? Hepatitis B If you have a higher risk for hepatitis B, you should be screened for this virus. Talk with your health  care provider to find out if you are at risk for hepatitis B infection. Hepatitis C Testing is recommended for:  Everyone born from [birth year] through 1966.  Anyone with known risk factors for hepatitis C. Sexually transmitted infections (STIs)  Get screened for STIs, including gonorrhea and chlamydia, if: ? You are sexually active and are younger than 65 years of age. ? You are older than 66 years of age and your health care provider tells you that you are at risk for this type of infection. ? Your sexual activity has changed since you were last screened, and you are at increased risk for chlamydia or gonorrhea. Ask your health care provider if you are at risk.  Ask your health care provider about whether you are at high risk for HIV. Your health care provider may recommend a prescription medicine to help prevent HIV infection. If you choose to take medicine to prevent HIV, you should first get tested for HIV. You should then be tested every 3 months for as long as you are taking the medicine. Pregnancy  If you are about to stop having your period (premenopausal) and you may become pregnant, seek counseling before you get pregnant.  Take 400 to 800 micrograms (mcg) of folic acid every day if you become pregnant.  Ask for birth control (contraception) if you want to prevent pregnancy. Osteoporosis and menopause Osteoporosis is a disease in which the bones lose minerals and strength with aging. This can result in bone fractures. If you are 7 years old or older, or if you are at risk for osteoporosis and fractures, ask your health care provider if you should:  Be screened for bone loss.  Take a calcium or vitamin D supplement to lower your risk of fractures.  Be given hormone replacement therapy (HRT) to treat symptoms of menopause. Follow these instructions at home: Lifestyle  Do not use any products that contain nicotine or tobacco, such  as cigarettes, e-cigarettes, and chewing tobacco.  If you need help quitting, ask your health care provider.  Do not use street drugs.  Do not share needles.  Ask your health care provider for help if you need support or information about quitting drugs. Alcohol use  Do not drink alcohol if: ? Your health care provider tells you not to drink. ? You are pregnant, may be pregnant, or are planning to become pregnant.  If you drink alcohol: ? Limit how much you use to 0-1 drink a day. ? Limit intake if you are breastfeeding.  Be aware of how much alcohol is in your drink. In the [place] one drink equals one 12 oz bottle of beer (355 mL), one 5 oz glass of wine (148 mL), or one 1 oz glass of hard liquor (44 mL). General instructions  Schedule regular health, dental, and eye exams.  Stay current with your vaccines.  Tell your health care provider if: ? You often feel depressed. ? You have ever been abused or do not feel safe at home. Summary  Adopting a healthy lifestyle and getting preventive care are important in promoting health and wellness.  Follow your health care provider's instructions about healthy diet, exercising, and getting tested or screened for diseases.  Follow your health care provider's instructions on monitoring your cholesterol and blood pressure. This information is not intended to replace advice given to you by your health care provider. Make sure you discuss any questions you have with your health care provider. Document Revised: 01/18/2018 Document Reviewed: 01/18/2018 Elsevier Patient Education  2021 [employer]. Jerrik Housholder M.D.

## 2020-06-10 NOTE — Progress Notes (Signed)
Cholesterol is higher than last year.  But  still  not in high risk  but if ldl is 190  advised Rest ok  The 10-year ASCVD risk score Mikey Bussing DC Brooke Bonito., et al., 2013) is: 3.5%   Values used to calculate the score:     Age: 65 years     Sex: Female     Is Non-Hispanic African American: No     Diabetic: No     Tobacco smoker: No     Systolic Blood Pressure: 438 mmHg     Is BP treated: No     HDL Cholesterol: 80 mg/dL     Total Cholesterol: 274 mg/dL

## 2020-06-13 ENCOUNTER — Encounter: Payer: Self-pay | Admitting: Internal Medicine

## 2020-06-18 ENCOUNTER — Telehealth: Payer: Self-pay | Admitting: Internal Medicine

## 2020-06-18 ENCOUNTER — Other Ambulatory Visit: Payer: Self-pay

## 2020-06-18 ENCOUNTER — Ambulatory Visit (INDEPENDENT_AMBULATORY_CARE_PROVIDER_SITE_OTHER)
Admission: RE | Admit: 2020-06-18 | Discharge: 2020-06-18 | Disposition: A | Discharge status: Home / Self Care | Payer: PPO | Source: Ambulatory Visit | Attending: Internal Medicine | Admitting: Internal Medicine

## 2020-06-18 DIAGNOSIS — N951 Menopausal and female climacteric states: Secondary | ICD-10-CM

## 2020-06-18 DIAGNOSIS — E2839 Other primary ovarian failure: Secondary | ICD-10-CM | POA: Diagnosis not present

## 2020-06-18 NOTE — Telephone Encounter (Signed)
Patient has been informed of her results. Please refer to result note.

## 2020-06-18 NOTE — Telephone Encounter (Signed)
Patient is returning the call, please advise. CB is [phone]

## 2020-07-20 NOTE — Progress Notes (Signed)
Mildly low bone density can discuss at upcoming visit

## 2020-07-21 ENCOUNTER — Other Ambulatory Visit: Payer: Self-pay

## 2020-07-21 NOTE — Progress Notes (Signed)
Chief Complaint  Patient presents with   Mass    Patient complains of mass on tricep,x2 weeks, Patient reports that mass is gone as of today    HPI: Lynn Griffin 65 y.o. come in for  lump on arm where she noted a tender lump on the medial part of her upper arm about 8 cm above the elbow without obvious redness question warmth noted when she accidentally hit her elbow but it would predated and noticed it was tender. Since that time it is decreased in size and much less tender but was worried about cause at that time. She did see alternative medicine recently because she felt bad and may have had some mild viral symptoms and was told she had coxsackie but no diagnostic tests done on her body.  She is better no new medicines.  She is right-handed no trauma is physically active  Asked about her cholesterol would like to see do lifestyle intervention but does ask about the coronary artery calcium scan risk assessment. ROS: See pertinent positives and negatives per HPI.  Past Medical History:  Diagnosis Date   Breast cyst    Desensitization to allergy shot    hx of as a child    Exercise-induced asthma    Rectal bleeding 07/09/2010   Probably was beet juice     Treadmill stress test negative for angina pectoris    neg myoview 2008    Family History  Problem Relation Age of Onset   Anemia Mother    Parkinsonism Mother 57   Coronary artery disease Father 40   Ovarian cancer Sister    Healthy Brother    Healthy Sister    Healthy Sister     Social History   Socioeconomic History   Marital status: Married    Spouse name: Not on file   Number of children: Not on file   Years of education: Not on file   Highest education level: Not on file  Occupational History   Not on file  Tobacco Use   Smoking status: Former    Pack years: 0.00   Smokeless tobacco: Never  Vaping Use   Vaping Use: Never used  Substance and Sexual Activity   Alcohol use: Yes    Comment: occasionally     Drug use: No   Sexual activity: Never    Birth control/protection: Post-menopausal  Other Topics Concern   Not on file  Social History Narrative   Married   Former smoker   Regular Exercise- yes   hh of 2     1 cats   Child biirth x 2          Social Determinants of Radio broadcast assistant Strain: Not on file  Food Insecurity: Not on file  Transportation Needs: Not on file  Physical Activity: Not on file  Stress: Not on file  Social Connections: Not on file    Outpatient Medications Prior to Visit  Medication Sig Dispense Refill   ASHWAGANDHA PO Take 1 capsule by mouth daily. SeraLastin     Calcium-Magnesium-Vitamin D (CALCIUM MAGNESIUM PO) Take 2 capsules by mouth daily.     Cholecalciferol 125 MCG (5000 UT) capsule Take 5,000 Units by mouth daily.     DIGESTIVE ENZYMES PO Take by mouth.     MILK THISTLE PO Take 1 capsule by mouth daily.     Multiple Vitamins-Minerals (MULTIVITAMIN PO) Take 1 tablet by mouth daily.     NON FORMULARY Takes  Menopautonic at night     Omega-3 Fatty Acids (OMEGA 3 PO) Take by mouth.     OVER THE COUNTER MEDICATION Paranorm (for parasites)     OVER THE COUNTER MEDICATION Transitions (herbal supplement for menopause)     Zinc 30 MG CAPS Take 1 capsule by mouth daily.     No facility-administered medications prior to visit.     EXAM:  BP 100/60 (BP Location: Left Arm, Patient Position: Sitting, Cuff Size: Normal)   Pulse 75   Temp 98.6 F (37 C) (Oral)   Ht 5\' 7"  (1.702 m)   Wt 129 lb 3.2 oz (58.6 kg)   LMP 02/08/2010   SpO2 96%   BMI 20.24 kg/m   Body mass index is 20.24 kg/m.  GENERAL: vitals reviewed and listed above, alert, oriented, appears well hydrated and in no acute distress HEENT: atraumatic, conjunctiva  clear, no obvious abnormalities on inspection of external nose and ears OP : Masked NECK: no obvious masses on inspection palpation   CV: HRRR, no clubbing cyanosis or  peripheral edema nl cap refill  MS:  moves all extremities without noticeable focal  abnormality right arm normal range of motion normal by inspection cart neurovascular normal there is a fullness palpable on the medial distal biceps area with minimal tenderness no bruising no skin changes.  There is no axillary adenopathy or obvious epitrochlear adenopathy no edema PSYCH: pleasant and cooperative, no obvious depression or anxiety Lab Results  Component Value Date   WBC 5.8 06/09/2020   HGB 13.5 06/09/2020   HCT 40.2 06/09/2020   PLT 232.0 06/09/2020   GLUCOSE 86 06/09/2020   CHOL 274 (H) 06/09/2020   TRIG 68.0 06/09/2020   HDL 80.00 06/09/2020   LDLDIRECT 185.9 08/15/2012   LDLCALC 180 (H) 06/09/2020   ALT 24 06/09/2020   AST 32 06/09/2020   NA 137 06/09/2020   K 4.6 06/09/2020   CL 104 06/09/2020   CREATININE 0.96 06/09/2020   BUN 16 06/09/2020   CO2 25 06/09/2020   TSH 3.23 06/09/2020   HGBA1C 5.7 12/24/2016   BP Readings from Last 3 Encounters:  07/22/20 100/60  06/09/20 100/64  06/08/19 112/72    ASSESSMENT AND PLAN:  Discussed the following assessment and plan:  Lump  of right upper extremity  Hyperlipidemia, unspecified hyperlipidemia type - Plan: Lipid panel, CT CARDIAC SCORING (SELF PAY ONLY)  Cardiovascular risk factor - Plan: CT CARDIAC SCORING (SELF PAY ONLY) Uncertain cause of the thickening if she had had an injury would consider a partial tear or a cyst but unclear since it is almost gone and minimal at this point will follow if persistent or progressive after a month consider sports medicine or other evaluation possibly point-of-care ultrasound imaging etc.  In regard of her lipids will order coronary artery calcium scan for risk assessment she is not keen on adding a statin medicine we will add lifestyle intervention and check lipids in 4 to 6 months either way. -Patient advised to return or notify health care team  if  new concerns arise. Record review discussion evaluation ordering and  planning 30 minutes Patient Instructions  This doesn't not seem to be a lymph gland  and advise observing .  If ongoing concerns after a month or so or if enlarging again we can reassess .   Will order CACscan  for risk assessment   Future lipid panel in 4-6 months     Gram Siedlecki K. Makailyn Mccormick M.D.

## 2020-07-22 ENCOUNTER — Ambulatory Visit (INDEPENDENT_AMBULATORY_CARE_PROVIDER_SITE_OTHER): Payer: PPO | Admitting: Internal Medicine

## 2020-07-22 ENCOUNTER — Encounter: Payer: Self-pay | Admitting: Internal Medicine

## 2020-07-22 VITALS — BP 100/60 | HR 75 | Temp 98.6°F | Ht 67.0 in | Wt 129.2 lb

## 2020-07-22 DIAGNOSIS — E785 Hyperlipidemia, unspecified: Secondary | ICD-10-CM

## 2020-07-22 DIAGNOSIS — R2231 Localized swelling, mass and lump, right upper limb: Secondary | ICD-10-CM

## 2020-07-22 DIAGNOSIS — Z9189 Other specified personal risk factors, not elsewhere classified: Secondary | ICD-10-CM | POA: Diagnosis not present

## 2020-07-22 NOTE — Patient Instructions (Signed)
This doesn't not seem to be a lymph gland  and advise observing .  If ongoing concerns after a month or so or if enlarging again we can reassess .   Will order CACscan  for risk assessment   Future lipid panel in 4-6 months

## 2020-08-29 ENCOUNTER — Other Ambulatory Visit: Payer: Self-pay

## 2020-08-29 ENCOUNTER — Ambulatory Visit (INDEPENDENT_AMBULATORY_CARE_PROVIDER_SITE_OTHER)
Admission: RE | Admit: 2020-08-29 | Discharge: 2020-08-29 | Disposition: A | Discharge status: Home / Self Care | Payer: Self-pay | Source: Ambulatory Visit | Attending: Internal Medicine | Admitting: Internal Medicine

## 2020-08-29 DIAGNOSIS — Z9189 Other specified personal risk factors, not elsewhere classified: Secondary | ICD-10-CM

## 2020-08-29 DIAGNOSIS — E785 Hyperlipidemia, unspecified: Secondary | ICD-10-CM

## 2020-09-07 NOTE — Progress Notes (Signed)
Cac of 0 is favorable  .  Continue lifestyle intervention healthy eating and exercise .  Consider  repeat in 4-5 years or as indicated

## 2020-09-08 ENCOUNTER — Telehealth: Payer: Self-pay | Admitting: Internal Medicine

## 2020-09-08 NOTE — Telephone Encounter (Signed)
Spoke with patient to schedule Medicare Annual Wellness Visit (AWV) either virtually or in office.  She stated she will call back to schedule  she was driving at the time  awvi 09/08/20 per [facility]  please schedule at anytime with [facility] Nurse Health Advisor 1 or 2   This should be a 45 minute visit.

## 2020-10-07 ENCOUNTER — Other Ambulatory Visit: Payer: Self-pay

## 2020-10-07 ENCOUNTER — Ambulatory Visit (INDEPENDENT_AMBULATORY_CARE_PROVIDER_SITE_OTHER): Payer: PPO | Admitting: Family Medicine

## 2020-10-07 ENCOUNTER — Encounter: Payer: Self-pay | Admitting: Family Medicine

## 2020-10-07 VITALS — BP 110/70 | HR 71 | Resp 16 | Ht 67.0 in | Wt 128.2 lb

## 2020-10-07 DIAGNOSIS — R350 Frequency of micturition: Secondary | ICD-10-CM

## 2020-10-07 DIAGNOSIS — R102 Pelvic and perineal pain: Secondary | ICD-10-CM

## 2020-10-07 LAB — POCT URINALYSIS DIPSTICK
Bilirubin, UA: NEGATIVE
Blood, UA: NEGATIVE
Glucose, UA: NEGATIVE
Ketones, UA: NEGATIVE
Leukocytes, UA: NEGATIVE
Nitrite, UA: NEGATIVE
Protein, UA: NEGATIVE
Spec Grav, UA: 1.015 (ref 1.010–1.025)
Urobilinogen, UA: 0.2 E.U./dL
pH, UA: 6 (ref 5.0–8.0)

## 2020-10-07 NOTE — Patient Instructions (Addendum)
A few things to remember from today's visit:   Urinary frequency - Plan: POCT urinalysis dipstick, Urine culture ? Overactive bladder. We will send urine for culture and treat accordingly. Continue adequate hydration.  If you need refills please call your pharmacy. Do not use My Chart to request refills or for acute issues that need immediate attention.    Please be sure medication list is accurate. If a new problem present, please set up appointment sooner than planned today.

## 2020-10-07 NOTE — Progress Notes (Signed)
ACUTE VISIT Chief Complaint  Patient presents with   Urinary Tract Infection    On & off for a while   HPI: Ms.Lynn Griffin is a 65 y.o. female, who is here today c/o urinary symptoms, she is concerned about possible UTI. Urinary frequency. Negative for dysuria. No odorous or cloudy urine. Nocturia "sometimes." She has had symptoms intermittently for a while. Seem to be exacerbated by dehydration. Taking OTC D-mannose 2 cap bid seems to help with preventing and treating symptoms.  Urinary Frequency  This is a recurrent problem. The current episode started in the past 7 days. The problem has been gradually improving. The patient is experiencing no pain. There has been no fever. She is Sexually active. There is No history of pyelonephritis. Associated symptoms include frequency and urgency. Pertinent negatives include no chills, discharge, flank pain, hematuria, hesitancy, nausea, sweats or vomiting. There is no history of catheterization, kidney stones or recurrent UTIs.  Occasionally suprapubic pressure, mild,not radiated. Last episode 2 days ago. She has not noted blood in stool.  Review of Systems  Constitutional:  Negative for activity change, appetite change, chills and fatigue.  Gastrointestinal:  Negative for nausea and vomiting.       Negative for changes in bowel movement.  Genitourinary:  Positive for frequency and urgency. Negative for flank pain, hematuria, hesitancy, vaginal discharge and vaginal pain.  Musculoskeletal:  Negative for back pain, gait problem and myalgias.  Neurological:  Negative for syncope and weakness.  Psychiatric/Behavioral:  Negative for confusion.   Rest see pertinent positives and negatives per HPI.  Current Outpatient Medications on File Prior to Visit  Medication Sig Dispense Refill   ASHWAGANDHA PO Take 1 capsule by mouth daily. SeraLastin     Calcium-Magnesium-Vitamin D (CALCIUM MAGNESIUM PO) Take 2 capsules by mouth daily.      Cholecalciferol 125 MCG (5000 UT) capsule Take 5,000 Units by mouth daily.     DIGESTIVE ENZYMES PO Take by mouth.     MILK THISTLE PO Take 1 capsule by mouth daily.     Multiple Vitamins-Minerals (MULTIVITAMIN PO) Take 1 tablet by mouth daily.     NON FORMULARY Takes Menopautonic at night     Omega-3 Fatty Acids (OMEGA 3 PO) Take by mouth.     OVER THE COUNTER MEDICATION Paranorm (for parasites)     OVER THE COUNTER MEDICATION Transitions (herbal supplement for menopause)     Zinc 30 MG CAPS Take 1 capsule by mouth daily.     No current facility-administered medications on file prior to visit.   Past Medical History:  Diagnosis Date   Breast cyst    Desensitization to allergy shot    hx of as a child    Exercise-induced asthma    Rectal bleeding 07/09/2010   Probably was beet juice     Treadmill stress test negative for angina pectoris    neg myoview 2008   No Known Allergies  Social History   Socioeconomic History   Marital status: Married    Spouse name: Not on file   Number of children: Not on file   Years of education: Not on file   Highest education level: Not on file  Occupational History   Not on file  Tobacco Use   Smoking status: Former   Smokeless tobacco: Never  Vaping Use   Vaping Use: Never used  Substance and Sexual Activity   Alcohol use: Yes    Comment: occasionally    Drug use:  No   Sexual activity: Never    Birth control/protection: Post-menopausal  Other Topics Concern   Not on file  Social History Narrative   Married   Former smoker   Regular Exercise- yes   hh of 2     1 cats   Child biirth x 2          Social Determinants of Radio broadcast assistant Strain: Not on file  Food Insecurity: Not on file  Transportation Needs: Not on file  Physical Activity: Not on file  Stress: Not on file  Social Connections: Not on file    Vitals:   10/07/20 0703  BP: 110/70  Pulse: 71  Resp: 16  SpO2: 99%   Body mass index is 20.09  kg/m.  Physical Exam Vitals and nursing note reviewed.  Constitutional:      General: She is not in acute distress.    Appearance: She is well-developed, well-groomed and normal weight.  HENT:     Head: Normocephalic and atraumatic.     Mouth/Throat:     Mouth: Mucous membranes are moist.  Eyes:     Conjunctiva/sclera: Conjunctivae normal.  Cardiovascular:     Rate and Rhythm: Normal rate and regular rhythm.     Heart sounds: No murmur heard. Pulmonary:     Effort: Pulmonary effort is normal. No respiratory distress.     Breath sounds: Normal breath sounds.  Abdominal:     Palpations: Abdomen is soft. There is no mass.     Tenderness: There is no abdominal tenderness. There is no right CVA tenderness or left CVA tenderness.  Musculoskeletal:     Right lower leg: No edema.     Left lower leg: No edema.  Skin:    General: Skin is warm.     Findings: No erythema.  Neurological:     Mental Status: She is alert and oriented to person, place, and time.     Gait: Gait normal.   ASSESSMENT AND PLAN:  Ms.Champagne was seen today for urinary tract infection.  Diagnoses and all orders for this visit: Orders Placed This Encounter  Procedures   Urine culture   POCT urinalysis dipstick   Urinary frequency We discussed possible etiologies, including mild cystitis and overactive bladder. Hx does not suggest a serious infectious process. Urine dipstick today negative. Continue D-mannose, which seems to help. Adequate hydration. Will send urine for culture and recommendations will be given according to results. F/U with PCP as needed.  Abdominal pain, suprapubic Resolved. Instructed about warning signs.  Return if symptoms worsen or fail to improve.   Cadyn Fann G. [place], MD  [facility]. [place] office.

## 2020-10-08 LAB — URINE CULTURE
MICRO NUMBER:: 12309639
Result:: NO GROWTH
SPECIMEN QUALITY:: ADEQUATE

## 2020-10-22 DIAGNOSIS — Z86006 Personal history of melanoma in-situ: Secondary | ICD-10-CM | POA: Diagnosis not present

## 2020-10-22 DIAGNOSIS — D171 Benign lipomatous neoplasm of skin and subcutaneous tissue of trunk: Secondary | ICD-10-CM | POA: Diagnosis not present

## 2020-10-22 DIAGNOSIS — D225 Melanocytic nevi of trunk: Secondary | ICD-10-CM | POA: Diagnosis not present

## 2020-10-22 DIAGNOSIS — L821 Other seborrheic keratosis: Secondary | ICD-10-CM | POA: Diagnosis not present

## 2020-10-22 DIAGNOSIS — Z808 Family history of malignant neoplasm of other organs or systems: Secondary | ICD-10-CM | POA: Diagnosis not present

## 2020-10-22 DIAGNOSIS — L570 Actinic keratosis: Secondary | ICD-10-CM | POA: Diagnosis not present

## 2020-10-22 DIAGNOSIS — D224 Melanocytic nevi of scalp and neck: Secondary | ICD-10-CM | POA: Diagnosis not present

## 2020-10-22 DIAGNOSIS — Z86018 Personal history of other benign neoplasm: Secondary | ICD-10-CM | POA: Diagnosis not present

## 2020-10-22 DIAGNOSIS — L578 Other skin changes due to chronic exposure to nonionizing radiation: Secondary | ICD-10-CM | POA: Diagnosis not present

## 2020-11-13 DIAGNOSIS — U071 COVID-19: Secondary | ICD-10-CM | POA: Diagnosis not present

## 2020-11-13 DIAGNOSIS — Z20822 Contact with and (suspected) exposure to covid-19: Secondary | ICD-10-CM | POA: Diagnosis not present

## 2020-11-17 ENCOUNTER — Ambulatory Visit: Payer: PPO

## 2020-11-19 DIAGNOSIS — H43813 Vitreous degeneration, bilateral: Secondary | ICD-10-CM | POA: Diagnosis not present

## 2020-11-19 DIAGNOSIS — H2513 Age-related nuclear cataract, bilateral: Secondary | ICD-10-CM | POA: Diagnosis not present

## 2020-11-19 DIAGNOSIS — H524 Presbyopia: Secondary | ICD-10-CM | POA: Diagnosis not present

## 2020-11-24 ENCOUNTER — Telehealth: Payer: Self-pay | Admitting: Internal Medicine

## 2020-11-24 NOTE — Telephone Encounter (Signed)
Pt informed of the message and verbalized understanding  

## 2020-11-24 NOTE — Telephone Encounter (Signed)
we dont have studies that  address this   But I think it would be    Ok  to  give at any time after feeling better

## 2020-11-24 NOTE — Telephone Encounter (Signed)
Pt is calling again and would like to speak with mykal again. Please advise

## 2020-11-24 NOTE — Telephone Encounter (Signed)
Patient is wanting the Shingrix vaccine and is inquiring on the Chicken Pox vaccine.  She has an appt on 12/08/20 for a flu shot and is wanting a response about these vaccines as well.  Patient sent the message through [place].

## 2020-11-24 NOTE — Telephone Encounter (Signed)
So she can receive the Shingrix vaccine at her pharmacy since it is not covered by [employer] in the office setting.  She should not need the chickenpox vaccine because she would be getting the Shingrix vaccine. Most people your age have had chickenpox or exposed to it.  This is of concern we can order a chickenpox titer or varicella titer but people born before 1957 almost always immunity from chickenpox.  a high-dose flu shot at any time here or at her pharmacy.

## 2020-11-24 NOTE — Telephone Encounter (Signed)
Left a message for the pt to return my call.  

## 2020-11-25 NOTE — Telephone Encounter (Signed)
Pt inquired the pricing of Varicella titer and has been given information to [facility] billing to assist her further.

## 2020-12-08 ENCOUNTER — Ambulatory Visit: Payer: PPO

## 2020-12-08 ENCOUNTER — Other Ambulatory Visit: Payer: Self-pay

## 2020-12-08 ENCOUNTER — Ambulatory Visit (INDEPENDENT_AMBULATORY_CARE_PROVIDER_SITE_OTHER): Payer: PPO

## 2020-12-08 DIAGNOSIS — Z23 Encounter for immunization: Secondary | ICD-10-CM

## 2020-12-13 DIAGNOSIS — Z8041 Family history of malignant neoplasm of ovary: Secondary | ICD-10-CM

## 2020-12-15 NOTE — Telephone Encounter (Signed)
So I advise a referral to genetic counseling.    If she agrees  please place the referral for genetic counseling ,diagnosis family history ovarian cancer.

## 2020-12-30 ENCOUNTER — Telehealth: Payer: Self-pay

## 2020-12-30 ENCOUNTER — Other Ambulatory Visit: Payer: Self-pay | Admitting: Internal Medicine

## 2020-12-30 DIAGNOSIS — Z1231 Encounter for screening mammogram for malignant neoplasm of breast: Secondary | ICD-10-CM

## 2020-12-30 NOTE — Telephone Encounter (Signed)
Please disregard

## 2020-12-30 NOTE — Addendum Note (Signed)
Addended by: Nilda Riggs on: 12/30/2020 02:27 PM   Modules accepted: Orders

## 2020-12-30 NOTE — Telephone Encounter (Signed)
I spoke with Neoma Laming our referral coordinator at [facility] and she was unsure about genetics referral. After contacting [facility] they informed me that they do have genetic testing available at their facility. New referral has been placed and pt is aware.

## 2020-12-31 ENCOUNTER — Ambulatory Visit
Admission: RE | Admit: 2020-12-31 | Discharge: 2020-12-31 | Disposition: A | Discharge status: Home / Self Care | Payer: PPO | Source: Ambulatory Visit | Attending: Internal Medicine | Admitting: Internal Medicine

## 2020-12-31 DIAGNOSIS — Z1231 Encounter for screening mammogram for malignant neoplasm of breast: Secondary | ICD-10-CM

## 2021-01-02 ENCOUNTER — Telehealth: Payer: Self-pay | Admitting: Genetic Counselor

## 2021-01-02 NOTE — Telephone Encounter (Signed)
Scheduled appt per 11/22 referral. Pt is aware of appt date and time.  

## 2021-01-05 ENCOUNTER — Ambulatory Visit: Payer: PPO

## 2021-01-18 ENCOUNTER — Other Ambulatory Visit: Payer: Self-pay | Admitting: Genetic Counselor

## 2021-01-18 DIAGNOSIS — Z8041 Family history of malignant neoplasm of ovary: Secondary | ICD-10-CM

## 2021-01-19 ENCOUNTER — Encounter: Payer: PPO | Admitting: Genetic Counselor

## 2021-01-19 ENCOUNTER — Other Ambulatory Visit: Payer: PPO

## 2021-02-18 ENCOUNTER — Inpatient Hospital Stay: Payer: PPO | Admitting: Genetic Counselor

## 2021-02-18 ENCOUNTER — Other Ambulatory Visit: Payer: Self-pay | Admitting: Genetic Counselor

## 2021-02-18 ENCOUNTER — Other Ambulatory Visit: Payer: Self-pay

## 2021-02-18 ENCOUNTER — Inpatient Hospital Stay: Payer: PPO | Attending: Genetic Counselor | Admitting: Genetic Counselor

## 2021-02-18 DIAGNOSIS — C4371 Malignant melanoma of right lower limb, including hip: Secondary | ICD-10-CM

## 2021-02-18 DIAGNOSIS — Z8041 Family history of malignant neoplasm of ovary: Secondary | ICD-10-CM

## 2021-02-18 DIAGNOSIS — Z8049 Family history of malignant neoplasm of other genital organs: Secondary | ICD-10-CM

## 2021-02-18 LAB — GENETIC SCREENING ORDER

## 2021-02-19 ENCOUNTER — Encounter: Payer: Self-pay | Admitting: Genetic Counselor

## 2021-02-19 DIAGNOSIS — C439 Malignant melanoma of skin, unspecified: Secondary | ICD-10-CM | POA: Insufficient documentation

## 2021-02-19 DIAGNOSIS — Z8049 Family history of malignant neoplasm of other genital organs: Secondary | ICD-10-CM | POA: Insufficient documentation

## 2021-02-19 DIAGNOSIS — Z8041 Family history of malignant neoplasm of ovary: Secondary | ICD-10-CM | POA: Insufficient documentation

## 2021-02-19 NOTE — Progress Notes (Signed)
REFERRING PROVIDER: Burnis Medin, MD [place],  [place] [address]  PRIMARY PROVIDER:  Burnis Medin, MD  PRIMARY REASON FOR VISIT:  1. Family history of ovarian cancer   2. Malignant melanoma of right lower extremity including hip ([place])   3. Family history of uterine cancer      HISTORY OF PRESENT ILLNESS:   Ms. Speece, a 66 y.o. female, was seen for a  cancer genetics consultation at the request of Dr. Regis Bill due to a personal and family history of cancer.  Ms. Slinger presents to clinic today to discuss the possibility of a hereditary predisposition to cancer, genetic testing, and to further clarify her future cancer risks, as well as potential cancer risks for family members.   In 2020, at the age of 33, Ms. Golinski was diagnosed with melanoma of the right shin. The treatment plan was Mose surgery.    CANCER HISTORY:  Oncology History   No history exists.     RISK FACTORS:  Menarche was at age 33.  First live birth at age 64.  OCP use for approximately  15  years.  Ovaries intact: yes.  Hysterectomy: no.  Menopausal status: postmenopausal.  HRT use: 0 years. Colonoscopy: yes; normal. Mammogram within the last year: yes. Number of breast biopsies: 0. Up to date with pelvic exams: no. Any excessive radiation exposure in the past: no  Past Medical History:  Diagnosis Date   Breast cyst    Desensitization to allergy shot    hx of as a child    Exercise-induced asthma    Family history of ovarian cancer    Family history of uterine cancer    Melanoma ([place])    Rectal bleeding 07/09/2010   Probably was beet juice     Treadmill stress test negative for angina pectoris    neg myoview 2008    Past Surgical History:  Procedure Laterality Date   childbirth x 2     hx allergy shots     left knee reconstruction  1989   MELANOMA EXCISION  04/19/2019   myoview stess     negative 2008    Social History   Socioeconomic History    Marital status: Married    Spouse name: Not on file   Number of children: Not on file   Years of education: Not on file   Highest education level: Not on file  Occupational History   Not on file  Tobacco Use   Smoking status: Former   Smokeless tobacco: Never  Vaping Use   Vaping Use: Never used  Substance and Sexual Activity   Alcohol use: Yes    Comment: occasionally    Drug use: No   Sexual activity: Never    Birth control/protection: Post-menopausal  Other Topics Concern   Not on file  Social History Narrative   Married   Former smoker   Regular Exercise- yes   hh of 2     1 cats   Child biirth x 2          Social Determinants of Radio broadcast assistant Strain: Not on file  Food Insecurity: Not on file  Transportation Needs: Not on file  Physical Activity: Not on file  Stress: Not on file  Social Connections: Not on file     FAMILY HISTORY:  We obtained a detailed, 4-generation family history.  Significant diagnoses are listed below: Family History  Problem Relation Age of Onset  Anemia Mother    Parkinsonism Mother 55   Coronary artery disease Father 66   Ovarian cancer Sister 23       d. 3   Uterine cancer Sister 5   Healthy Sister    Healthy Brother    Ovarian cancer Maternal Grandmother     The patient has two sons who are cancer free.  She has one brother and three sisters.  One sister died of ovarian cancer at 63.  Another sister was diagnosed with uterine cancer at 65.  Both parents are deceased.  The patient's mother died of Parkinson Disease.  She had a brother and sister who died of unknown causes, but they each had a child who died of an unknown cancer.  The maternal grandparents are deceased.  The patient's father died of a heart attack at 13.  He had one brother who is deceased.  The paternal grandparents are deceased. The grandmother had ovarian cancer and the grandfather died of a heart attack.  Ms. Primm is unaware of previous  family history of genetic testing for hereditary cancer risks. Patient's maternal ancestors are of [place] descent, and paternal ancestors are of [place] descent. There is no reported Ashkenazi Jewish ancestry. There is no known consanguinity.  GENETIC COUNSELING ASSESSMENT: Ms. Tucciarone is a 66 y.o. female with a personal and family history of cancer which is somewhat suggestive of a hereditary cancer syndrome and predisposition to cancer given the family history of ovarian cancer. We, therefore, discussed and recommended the following at today's visit.   DISCUSSION: We discussed that, in general, most cancer is not inherited in families, but instead is sporadic or familial. Sporadic cancers occur by chance and typically happen at older ages (>50 years) as this type of cancer is caused by genetic changes acquired during an individuals lifetime. Some families have more cancers than would be expected by chance; however, the ages or types of cancer are not consistent with a known genetic mutation or known genetic mutations have been ruled out. This type of familial cancer is thought to be due to a combination of multiple genetic, environmental, hormonal, and lifestyle factors. While this combination of factors likely increases the risk of cancer, the exact source of this risk is not currently identifiable or testable.  We discussed that 5 - 10% of cancer is hereditary, with ovarian cancer having a higher risk for being hereditary at up to 20%.  Most cases of ovarian cancer is associated with BRCA mutations.  There are other genes that can be associated with hereditary ovarian cancer syndromes.  These include BRIP1, RAD51C, RAD51D and the Lynch syndrome genes.  We discussed that testing is beneficial for several reasons including knowing how to follow individuals after completing their treatment, identifying whether potential treatment options such as PARP inhibitors would be beneficial, and understand  if other family members could be at risk for cancer and allow them to undergo genetic testing.   We reviewed the characteristics, features and inheritance patterns of hereditary cancer syndromes. We also discussed genetic testing, including the appropriate family members to test, the process of testing, insurance coverage and turn-around-time for results. We discussed the implications of a negative, positive, carrier and/or variant of uncertain significant result. We recommended Ms. Beighley pursue genetic testing for the CancerNext-expanded gene panel.   The CancerNext-Expanded gene panel offered by [facility] and includes sequencing and rearrangement analysis for the following 77 genes: AIP, ALK, APC*, ATM*, AXIN2, BAP1, BARD1, BLM, BMPR1A, BRCA1*, BRCA2*, BRIP1*,  CDC73, CDH1*, CDK4, CDKN1B, CDKN2A, CHEK2*, CTNNA1, DICER1, FANCC, FH, FLCN, GALNT12, KIF1B, LZTR1, MAX, MEN1, MET, MLH1*, MSH2*, MSH3, MSH6*, MUTYH*, NBN, NF1*, NF2, NTHL1, PALB2*, PHOX2B, PMS2*, POT1, PRKAR1A, PTCH1, PTEN*, RAD51C*, RAD51D*, RB1, RECQL, RET, SDHA, SDHAF2, SDHB, SDHC, SDHD, SMAD4, SMARCA4, SMARCB1, SMARCE1, STK11, SUFU, TMEM127, TP53*, TSC1, TSC2, VHL and XRCC2 (sequencing and deletion/duplication); EGFR, EGLN1, HOXB13, KIT, MITF, PDGFRA, POLD1, and POLE (sequencing only); EPCAM and GREM1 (deletion/duplication only). DNA and RNA analyses performed for * genes.   Based on Ms. Mercer's personal and family history of cancer, she meets medical criteria for genetic testing. Despite that she meets criteria, she may still have an out of pocket cost. We discussed that if her out of pocket cost for testing is over $100, the laboratory will call and confirm whether she wants to proceed with testing.  If the out of pocket cost of testing is less than $100 she will be billed by the genetic testing laboratory.   PLAN: After considering the risks, benefits, and limitations, Ms. Fiumara provided informed consent to pursue genetic testing and  the blood sample was sent to [facility] for analysis of the CancerNext-Expanded+RNAinsight. Results should be available within approximately 2-3 weeks' time, at which point they will be disclosed by telephone to Ms. Murley, as will any additional recommendations warranted by these results. Ms. Verhoeven will receive a summary of her genetic counseling visit and a copy of her results once available. This information will also be available in Epic.   Lastly, we encouraged Ms. Kassab to remain in contact with cancer genetics annually so that we can continuously update the family history and inform her of any changes in cancer genetics and testing that may be of benefit for this family.   Ms. Cardell's questions were answered to her satisfaction today. Our contact information was provided should additional questions or concerns arise. Thank you for the referral and allowing [place] to share in the care of your patient.   Macgregor Aeschliman P. Florene Glen, [place], [facility] Licensed, Insurance risk surveyor [place] [email or web address] phone: [phone]  The patient was seen for a total of 30 minutes in face-to-face genetic counseling.  The patient was seen alone.  This patient was discussed with Drs. Magrinat, Lindi Adie and/or Burr Medico who agrees with the above.    _______________________________________________________________________ For Office Staff:  Number of people involved in session: 1 Was an Intern/ student involved with case: no

## 2021-02-26 NOTE — Progress Notes (Signed)
Not sure what to do with this order  since I didn't order  directly.  Please advise

## 2021-03-03 ENCOUNTER — Telehealth: Payer: Self-pay | Admitting: Genetic Counselor

## 2021-03-03 ENCOUNTER — Encounter: Payer: Self-pay | Admitting: Genetic Counselor

## 2021-03-03 ENCOUNTER — Ambulatory Visit: Payer: Self-pay | Admitting: Genetic Counselor

## 2021-03-03 DIAGNOSIS — Z1379 Encounter for other screening for genetic and chromosomal anomalies: Secondary | ICD-10-CM

## 2021-03-03 DIAGNOSIS — C4371 Malignant melanoma of right lower limb, including hip: Secondary | ICD-10-CM

## 2021-03-03 NOTE — Progress Notes (Signed)
HPI:  Lynn Griffin was previously seen in the [place] clinic due to a personal and family history of cancer and concerns regarding a hereditary predisposition to cancer. Please refer to our prior cancer genetics clinic note for more information regarding our discussion, assessment and recommendations, at the time. Lynn Griffin's recent genetic test results were disclosed to her, as were recommendations warranted by these results. These results and recommendations are discussed in more detail below.  CANCER HISTORY:  Oncology History   No history exists.    FAMILY HISTORY:  We obtained a detailed, 4-generation family history.  Significant diagnoses are listed below: Family History  Problem Relation Age of Onset   Anemia Mother    Parkinsonism Mother 38   Coronary artery disease Father 6   Ovarian cancer Sister 22       d. 74   Uterine cancer Sister 62   Healthy Sister    Healthy Brother    Ovarian cancer Maternal Grandmother     The patient has two sons who are cancer free.  She has one brother and three sisters.  One sister died of ovarian cancer at 40.  Another sister was diagnosed with uterine cancer at 30.  Both parents are deceased.   The patient's mother died of Parkinson Disease.  She had a brother and sister who died of unknown causes, but they each had a child who died of an unknown cancer.  The maternal grandparents are deceased.   The patient's father died of a heart attack at 39.  He had one brother who is deceased.  The paternal grandparents are deceased. The grandmother had ovarian cancer and the grandfather died of a heart attack.   Lynn Griffin is unaware of previous family history of genetic testing for hereditary cancer risks. Patient's maternal ancestors are of [place] descent, and paternal ancestors are of [place] descent. There is no reported Ashkenazi Jewish ancestry. There is no known consanguinity.  GENETIC TEST RESULTS: Genetic testing  reported out on February 28, 2021 through the CancerNext-Expanded+RNAinsight cancer panel found no pathogenic mutations. The CancerNext-Expanded gene panel offered by [facility] and includes sequencing and rearrangement analysis for the following 77 genes: AIP, ALK, APC*, ATM*, AXIN2, BAP1, BARD1, BLM, BMPR1A, BRCA1*, BRCA2*, BRIP1*, CDC73, CDH1*, CDK4, CDKN1B, CDKN2A, CHEK2*, CTNNA1, DICER1, FANCC, FH, FLCN, GALNT12, KIF1B, LZTR1, MAX, MEN1, MET, MLH1*, MSH2*, MSH3, MSH6*, MUTYH*, NBN, NF1*, NF2, NTHL1, PALB2*, PHOX2B, PMS2*, POT1, PRKAR1A, PTCH1, PTEN*, RAD51C*, RAD51D*, RB1, RECQL, RET, SDHA, SDHAF2, SDHB, SDHC, SDHD, SMAD4, SMARCA4, SMARCB1, SMARCE1, STK11, SUFU, TMEM127, TP53*, TSC1, TSC2, VHL and XRCC2 (sequencing and deletion/duplication); EGFR, EGLN1, HOXB13, KIT, MITF, PDGFRA, POLD1, and POLE (sequencing only); EPCAM and GREM1 (deletion/duplication only). DNA and RNA analyses performed for * genes. The test report has been scanned into EPIC and is located under the Molecular Pathology section of the Results Review tab.  A portion of the result report is included below for reference.     We discussed with Lynn Griffin that because current genetic testing is not perfect, it is possible there may be a gene mutation in one of these genes that current testing cannot detect, but that chance is small.  We also discussed, that there could be another gene that has not yet been discovered, or that we have not yet tested, that is responsible for the cancer diagnoses in the family. It is also possible there is a hereditary cause for the cancer in the family that Lynn Griffin did not inherit  and therefore was not identified in her testing.  Therefore, it is important to remain in touch with cancer genetics in the future so that we can continue to offer Lynn Griffin the most up to date genetic testing.   ADDITIONAL GENETIC TESTING: We discussed with Lynn Griffin that her genetic testing was fairly extensive.  If there  are genes identified to increase cancer risk that can be analyzed in the future, we would be happy to discuss and coordinate this testing at that time.    CANCER SCREENING RECOMMENDATIONS: Lynn Griffin test result is considered negative (normal).  This means that we have not identified a hereditary cause for her personal and family history of cancer at this time. Most cancers happen by chance and this negative test suggests that her cancer may fall into this category.    While reassuring, this does not definitively rule out a hereditary predisposition to cancer. It is still possible that there could be genetic mutations that are undetectable by current technology. There could be genetic mutations in genes that have not been tested or identified to increase cancer risk.  Therefore, it is recommended she continue to follow the cancer management and screening guidelines provided by her primary healthcare provider.   An individual's cancer risk and medical management are not determined by genetic test results alone. Overall cancer risk assessment incorporates additional factors, including personal medical history, family history, and any available genetic information that may result in a personalized plan for cancer prevention and surveillance  RECOMMENDATIONS FOR FAMILY MEMBERS:  Individuals in this family might be at some increased risk of developing cancer, over the general population risk, simply due to the family history of cancer.  We recommended women in this family have a yearly mammogram beginning at age 1, or 14 years younger than the earliest onset of cancer, an annual clinical breast exam, and perform monthly breast self-exams. Women in this family should also have a gynecological exam as recommended by their primary provider. All family members should be referred for colonoscopy starting at age 25.  It is also possible there is a hereditary cause for the cancer in Lynn Griffin's family that she did  not inherit and therefore was not identified in her.  Based on Lynn Griffin family history, we recommended her sister, who was diagnosed with uterine cancer at age 25, have genetic counseling and testing. Lynn Griffin will let us know if we can be of any assistance in coordinating genetic counseling and/or testing for this family member.   FOLLOW-UP: Lastly, we discussed with Lynn Griffin that cancer genetics is a rapidly advancing field and it is possible that new genetic tests will be appropriate for her and/or her family members in the future. We encouraged her to remain in contact with cancer genetics on an annual basis so we can update her personal and family histories and let her know of advances in cancer genetics that may benefit this family.   Our contact number was provided. Lynn Griffin's questions were answered to her satisfaction, and she knows she is welcome to call us at anytime with additional questions or concerns.   Roma Kayser, [place], [facility] Licensed, Certified Genetic Counselor [email or web address]

## 2021-03-03 NOTE — Telephone Encounter (Signed)
Revealed negative genetic testing.  Discussed that we do not know why there is cancer in the family. It could be due to a different gene that we are not testing, or maybe our current technology may not be able to pick something up.  It will be important for her to keep in contact with genetics to keep up with whether additional testing may be needed.  

## 2021-03-19 DIAGNOSIS — Z23 Encounter for immunization: Secondary | ICD-10-CM | POA: Diagnosis not present

## 2021-03-19 DIAGNOSIS — D485 Neoplasm of uncertain behavior of skin: Secondary | ICD-10-CM | POA: Diagnosis not present

## 2021-03-19 DIAGNOSIS — L570 Actinic keratosis: Secondary | ICD-10-CM | POA: Diagnosis not present

## 2021-03-26 DIAGNOSIS — M25512 Pain in left shoulder: Secondary | ICD-10-CM | POA: Diagnosis not present

## 2021-04-05 DIAGNOSIS — M25512 Pain in left shoulder: Secondary | ICD-10-CM | POA: Diagnosis not present

## 2021-04-14 DIAGNOSIS — M75122 Complete rotator cuff tear or rupture of left shoulder, not specified as traumatic: Secondary | ICD-10-CM | POA: Diagnosis not present

## 2021-05-12 DIAGNOSIS — D171 Benign lipomatous neoplasm of skin and subcutaneous tissue of trunk: Secondary | ICD-10-CM | POA: Diagnosis not present

## 2021-05-12 DIAGNOSIS — L570 Actinic keratosis: Secondary | ICD-10-CM | POA: Diagnosis not present

## 2021-05-12 DIAGNOSIS — Z86006 Personal history of melanoma in-situ: Secondary | ICD-10-CM | POA: Diagnosis not present

## 2021-05-12 DIAGNOSIS — D224 Melanocytic nevi of scalp and neck: Secondary | ICD-10-CM | POA: Diagnosis not present

## 2021-05-12 DIAGNOSIS — L578 Other skin changes due to chronic exposure to nonionizing radiation: Secondary | ICD-10-CM | POA: Diagnosis not present

## 2021-05-12 DIAGNOSIS — D485 Neoplasm of uncertain behavior of skin: Secondary | ICD-10-CM | POA: Diagnosis not present

## 2021-05-12 DIAGNOSIS — L821 Other seborrheic keratosis: Secondary | ICD-10-CM | POA: Diagnosis not present

## 2021-05-12 DIAGNOSIS — Z808 Family history of malignant neoplasm of other organs or systems: Secondary | ICD-10-CM | POA: Diagnosis not present

## 2021-05-12 DIAGNOSIS — D225 Melanocytic nevi of trunk: Secondary | ICD-10-CM | POA: Diagnosis not present

## 2021-05-12 DIAGNOSIS — Z86018 Personal history of other benign neoplasm: Secondary | ICD-10-CM | POA: Diagnosis not present

## 2021-07-03 ENCOUNTER — Telehealth: Payer: Self-pay

## 2021-07-03 NOTE — Telephone Encounter (Signed)
Attempted to call pt to schedule CPE. Last OV 07/22/20.

## 2021-08-04 ENCOUNTER — Ambulatory Visit (INDEPENDENT_AMBULATORY_CARE_PROVIDER_SITE_OTHER): Payer: PPO | Admitting: Internal Medicine

## 2021-08-04 ENCOUNTER — Encounter: Payer: Self-pay | Admitting: Internal Medicine

## 2021-08-04 VITALS — BP 104/62 | HR 73 | Temp 98.7°F | Ht 67.0 in | Wt 127.4 lb

## 2021-08-04 DIAGNOSIS — Z0000 Encounter for general adult medical examination without abnormal findings: Secondary | ICD-10-CM | POA: Diagnosis not present

## 2021-08-04 DIAGNOSIS — Z1379 Encounter for other screening for genetic and chromosomal anomalies: Secondary | ICD-10-CM | POA: Diagnosis not present

## 2021-08-04 DIAGNOSIS — Z79899 Other long term (current) drug therapy: Secondary | ICD-10-CM | POA: Diagnosis not present

## 2021-08-04 DIAGNOSIS — Z85820 Personal history of malignant melanoma of skin: Secondary | ICD-10-CM | POA: Diagnosis not present

## 2021-08-04 DIAGNOSIS — E785 Hyperlipidemia, unspecified: Secondary | ICD-10-CM

## 2021-08-04 DIAGNOSIS — E559 Vitamin D deficiency, unspecified: Secondary | ICD-10-CM | POA: Diagnosis not present

## 2021-08-28 ENCOUNTER — Telehealth: Payer: Self-pay | Admitting: Internal Medicine

## 2021-08-28 NOTE — Telephone Encounter (Signed)
Left message for patient to call back and schedule Medicare Annual Wellness Visit (AWV) either virtually or in office. Left  my Lynn Griffin number [phone]    awvi 09/08/20 per [place] ; please schedule at anytime with [facility] Nurse Health Advisor 1 or 2   This should be a 45 minute visit.

## 2021-08-31 ENCOUNTER — Other Ambulatory Visit (INDEPENDENT_AMBULATORY_CARE_PROVIDER_SITE_OTHER): Payer: PPO

## 2021-08-31 DIAGNOSIS — Z79899 Other long term (current) drug therapy: Secondary | ICD-10-CM

## 2021-08-31 DIAGNOSIS — E559 Vitamin D deficiency, unspecified: Secondary | ICD-10-CM | POA: Diagnosis not present

## 2021-08-31 DIAGNOSIS — Z85820 Personal history of malignant melanoma of skin: Secondary | ICD-10-CM

## 2021-08-31 DIAGNOSIS — E785 Hyperlipidemia, unspecified: Secondary | ICD-10-CM | POA: Diagnosis not present

## 2021-08-31 LAB — CBC WITH DIFFERENTIAL/PLATELET
Basophils Absolute: 0.1 10*3/uL (ref 0.0–0.1)
Basophils Relative: 1.9 % (ref 0.0–3.0)
Eosinophils Absolute: 0.1 10*3/uL (ref 0.0–0.7)
Eosinophils Relative: 3.5 % (ref 0.0–5.0)
HCT: 39.5 % (ref 36.0–46.0)
Hemoglobin: 13.2 g/dL (ref 12.0–15.0)
Lymphocytes Relative: 36.8 % (ref 12.0–46.0)
Lymphs Abs: 1.5 10*3/uL (ref 0.7–4.0)
MCHC: 33.4 g/dL (ref 30.0–36.0)
MCV: 95.2 fl (ref 78.0–100.0)
Monocytes Absolute: 0.3 10*3/uL (ref 0.1–1.0)
Monocytes Relative: 6.8 % (ref 3.0–12.0)
Neutro Abs: 2.1 10*3/uL (ref 1.4–7.7)
Neutrophils Relative %: 51 % (ref 43.0–77.0)
Platelets: 226 10*3/uL (ref 150.0–400.0)
RBC: 4.14 Mil/uL (ref 3.87–5.11)
RDW: 12.6 % (ref 11.5–15.5)
WBC: 4.2 10*3/uL (ref 4.0–10.5)

## 2021-08-31 LAB — BASIC METABOLIC PANEL
BUN: 16 mg/dL (ref 6–23)
CO2: 25 mEq/L (ref 19–32)
Calcium: 9.5 mg/dL (ref 8.4–10.5)
Chloride: 107 mEq/L (ref 96–112)
Creatinine, Ser: 0.97 mg/dL (ref 0.40–1.20)
GFR: 60.7 mL/min (ref >60.00)
Glucose, Bld: 79 mg/dL (ref 70–99)
Potassium: 4.1 mEq/L (ref 3.5–5.1)
Sodium: 141 mEq/L (ref 135–145)

## 2021-08-31 LAB — LIPID PANEL
Cholesterol: 261 mg/dL — ABNORMAL HIGH (ref 0–200)
HDL: 82.2 mg/dL (ref >39.00)
LDL Cholesterol: 164 mg/dL — ABNORMAL HIGH (ref 0–99)
NonHDL: 178.41
Total CHOL/HDL Ratio: 3
Triglycerides: 73 mg/dL (ref 0.0–149.0)
VLDL: 14.6 mg/dL (ref 0.0–40.0)

## 2021-08-31 LAB — TSH: TSH: 4.11 u[IU]/mL (ref 0.35–5.50)

## 2021-08-31 LAB — T4, FREE: Free T4: 0.64 ng/dL (ref 0.60–1.60)

## 2021-08-31 LAB — VITAMIN D 25 HYDROXY (VIT D DEFICIENCY, FRACTURES): VITD: 119.23 ng/mL (ref 30.00–100.00)

## 2021-08-31 NOTE — Progress Notes (Signed)
Vit D level is too high  in dangerous zone   please stop vit d supplementation    Recheck Vit d level in  3-4 weeks .  Cholesterol slight improved from last year . Continue lifestyle intervention healthy eating and exercise .

## 2021-09-07 ENCOUNTER — Ambulatory Visit: Payer: PPO

## 2021-09-08 ENCOUNTER — Encounter: Payer: Self-pay | Admitting: Internal Medicine

## 2021-09-08 DIAGNOSIS — Z1211 Encounter for screening for malignant neoplasm of colon: Secondary | ICD-10-CM

## 2021-09-22 ENCOUNTER — Other Ambulatory Visit: Payer: PPO

## 2021-09-29 ENCOUNTER — Other Ambulatory Visit: Payer: PPO

## 2021-09-29 ENCOUNTER — Ambulatory Visit (INDEPENDENT_AMBULATORY_CARE_PROVIDER_SITE_OTHER): Payer: PPO

## 2021-09-29 VITALS — BP 102/60 | HR 69 | Temp 98.2°F | Ht 67.0 in | Wt 124.9 lb

## 2021-09-29 DIAGNOSIS — Z0000 Encounter for general adult medical examination without abnormal findings: Secondary | ICD-10-CM | POA: Diagnosis not present

## 2021-09-29 DIAGNOSIS — E559 Vitamin D deficiency, unspecified: Secondary | ICD-10-CM

## 2021-09-29 LAB — VITAMIN D 25 HYDROXY (VIT D DEFICIENCY, FRACTURES): VITD: 98.2 ng/mL (ref 30.00–100.00)

## 2021-09-29 NOTE — Progress Notes (Signed)
Vitamin D level in the high normal range now, improved

## 2021-09-29 NOTE — Progress Notes (Signed)
Subjective:   Lynn Griffin is a 66 y.o. female who presents for an Initial Medicare Annual Wellness Visit.  Review of Systems     Cardiac Risk Factors include: advanced age (>59mn, >>40women);dyslipidemia     Objective:    Today's Vitals   09/29/21 1034  BP: 102/60  Pulse: 69  Temp: 98.2 F (36.8 C)  TempSrc: Oral  SpO2: 99%  Weight: 124 lb 14.4 oz (56.7 kg)  Height: '5\' 7"'$  (1.702 m)   Body mass index is 19.56 kg/m.     09/29/2021   10:45 AM  Advanced Directives  Does Patient Have a Medical Advance Directive? Yes  Type of AParamedicof [facility]Living will  Copy of [place] Chart? Yes - validated most recent copy scanned in chart (See row information)    Current Medications (verified) Outpatient Encounter Medications as of 09/29/2021  Medication Sig   Multiple Vitamins-Minerals (MULTIVITAMIN PO) Take 1 tablet by mouth daily.   Omega-3 Fatty Acids (OMEGA 3 PO) Take by mouth.   OVER THE COUNTER MEDICATION Paranorm (for parasites)   OVER THE COUNTER MEDICATION Transitions (herbal supplement for menopause)   Probiotic Product (PROBIOTIC DAILY PO) Take 1 tablet by mouth daily.   ASHWAGANDHA PO Take 1 capsule by mouth daily. SeraLastin   Calcium-Magnesium-Vitamin D (CALCIUM MAGNESIUM PO) Take 2 capsules by mouth daily.   Cholecalciferol 125 MCG (5000 UT) capsule Take 5,000 Units by mouth daily.   DIGESTIVE ENZYMES PO Take by mouth. (Patient not taking: Reported on 09/29/2021)   MILK THISTLE PO Take 1 capsule by mouth daily. (Patient not taking: Reported on 08/04/2021)   NON FORMULARY Takes Menopautonic at night   Zinc 30 MG CAPS Take 1 capsule by mouth daily. (Patient not taking: Reported on 08/04/2021)   No facility-administered encounter medications on file as of 09/29/2021.    Allergies (verified) Patient has no known allergies.   History: Past Medical History:  Diagnosis Date   Breast cyst    Desensitization to  allergy shot    hx of as a child    Exercise-induced asthma    Family history of ovarian cancer    Family history of uterine cancer    [place]    Rectal bleeding 07/09/2010   Probably was beet juice     Treadmill stress test negative for angina pectoris    neg myoview 2008   Past Surgical History:  Procedure Laterality Date   childbirth x 2     hx allergy shots     left knee reconstruction  1989   MELANOMA EXCISION  04/19/2019   myoview stess     negative 2008   Family History  Problem Relation Age of Onset   Anemia Mother    Parkinsonism Mother 773  Coronary artery disease Father 447  Ovarian cancer Sister 780      d. 730  Uterine cancer Sister 51  Healthy Sister    Healthy Brother    Ovarian cancer Maternal Grandmother    Social History   Socioeconomic History   Marital status: Married    Spouse name: Not on file   Number of children: Not on file   Years of education: Not on file   Highest education level: Not on file  Occupational History   Not on file  Tobacco Use   Smoking status: Former   Smokeless tobacco: Never  Vaping Use   Vaping Use: Never used  Substance and Sexual Activity   Alcohol use: Yes    Comment: occasionally    Drug use: No   Sexual activity: Yes    Birth control/protection: Post-menopausal  Other Topics Concern   Not on file  Social History Narrative   Married   Former smoker   Regular Exercise- yes   hh of 2     1 cats   Child biirth x 2          Social Determinants of Health   Financial Resource Strain: Low Risk  (09/29/2021)   Overall Financial Resource Strain (CARDIA)    Difficulty of Paying Living Expenses: Not hard at all  Food Insecurity: No Food Insecurity (09/29/2021)   Hunger Vital Sign    Worried About Running Out of Food in the Last Year: Never true    Ran Out of Food in the Last Year: Never true  Transportation Needs: No Transportation Needs (09/29/2021)   PRAPARE - Transportation    Lack of  Transportation (Medical): No    Lack of Transportation (Non-Medical): No  Physical Activity: Sufficiently Active (09/29/2021)   Exercise Vital Sign    Days of Exercise per Week: 4 days    Minutes of Exercise per Session: 60 min  Stress: No Stress Concern Present (09/29/2021)   [place]    Feeling of Stress : Not at all  Social Connections: Not on file    Tobacco Counseling Counseling given: Not Answered   Clinical Intake:  Pre-visit preparation completed: Yes  Pain : No/denies pain     Nutritional Status: BMI of 19-24  Normal Nutritional Risks: None Diabetes: No  How often do you need to have someone help you when you read instructions, pamphlets, or other written materials from your doctor or pharmacy?: 1 - Never What is the last grade level you completed in school?: GRADUATE SCHOOL  Diabetic? no  Interpreter Needed?: No  Information entered by :: NAllen LPN   Activities of Daily Living    09/29/2021   10:46 AM  In your present state of health, do you have any difficulty performing the following activities:  Hearing? 1  Comment just a little  Vision? 0  Comment floaters  Difficulty concentrating or making decisions? 1  Comment some short term comes and goes  Walking or climbing stairs? 0  Dressing or bathing? 0  Doing errands, shopping? 0  Preparing Food and eating ? N  Using the Toilet? N  In the past six months, have you accidently leaked urine? Y  Comment only when has an UTI  Do you have problems with loss of bowel control? N  Managing your Medications? N  Managing your Finances? N  Housekeeping or managing your Housekeeping? N    Patient Care Team: Panosh, Standley Brooking, MD as PCP - Patsi Sears, MD as Attending Physician (Dermatology) Janne Lab, MD (Obstetrics and Gynecology)  Indicate any recent Medical Services you may have received from other than [facility] providers in the  past year (date may be approximate).     Assessment:   This is a routine wellness examination for Lynn Griffin.  Hearing/Vision screen Vision Screening - Comments:: Regular eye exams,   Dietary issues and exercise activities discussed: Current Exercise Habits: Home exercise routine, Type of exercise: walking;strength training/weights;yoga;Other - see comments (biking), Time (Minutes): 60, Frequency (Times/Week): 4, Weekly Exercise (Minutes/Week): 240   Goals Addressed  This Visit's Progress    Patient Stated       09/29/2021, work on memory       Depression Screen    09/29/2021   10:46 AM 08/04/2021    1:47 PM 07/22/2020    3:50 PM 06/08/2019    9:35 AM 12/02/2017   10:01 AM 10/19/2016    1:52 PM  PHQ 2/9 Scores  PHQ - 2 Score 0 2 2 0 0 0  PHQ- 9 Score  4 8 0      Fall Risk    09/29/2021   10:45 AM 08/04/2021    1:47 PM 07/22/2020    3:01 PM 10/19/2016    1:52 PM  [place] in the past year? 0 0 0 No  Number falls in past yr: 0 0 0   Injury with Fall? 0 0 0   Risk for fall due to : No Fall Risks No Fall Risks    Follow up Falls evaluation completed;Education provided;Falls prevention discussed Falls prevention discussed      FALL RISK PREVENTION PERTAINING TO THE HOME:  Any stairs in or around the home? No  If so, are there any without handrails? N/a Home free of loose throw rugs in walkways, pet beds, electrical cords, etc? Yes  Adequate lighting in your home to reduce risk of falls? Yes   ASSISTIVE DEVICES UTILIZED TO PREVENT FALLS:  Life alert? No  Use of a cane, walker or w/c? No  Grab bars in the bathroom? No  Shower chair or bench in shower? No  Elevated toilet seat or a handicapped toilet? No   TIMED UP AND GO:  Was the test performed? No .     Gait steady and fast without use of assistive device  Cognitive Function:        09/29/2021   10:50 AM  6CIT Screen  What Year? 0 points  What month? 0 points  What time? 0 points   Count back from 20 0 points  Months in reverse 0 points  Repeat phrase 6 points  Total Score 6 points    Immunizations Immunization History  Administered Date(s) Administered   Fluad Quad(high Dose 65+) 12/08/2020   Influenza Whole 12/31/2008, 02/06/2010   Influenza,inj,Quad PF,6+ Mos 10/23/2013, 10/15/2015   PFIZER(Purple Top)SARS-COV-2 Vaccination 05/21/2019, 06/18/2019, 02/19/2020   PNEUMOCOCCAL CONJUGATE-20 08/04/2021   Td 06/22/2005   Tdap 09/04/2014    TDAP status: Up to date  Flu Vaccine status: Due, Education has been provided regarding the importance of this vaccine. Advised may receive this vaccine at local pharmacy or Health Dept. Aware to provide a copy of the vaccination record if obtained from local pharmacy or Health Dept. Verbalized acceptance and understanding.  Pneumococcal vaccine status: Up to date  Covid-19 vaccine status: Completed vaccines  Qualifies for Shingles Vaccine? Yes   Zostavax completed No   Shingrix Completed?: No.    Education has been provided regarding the importance of this vaccine. Patient has been advised to call insurance company to determine out of pocket expense if they have not yet received this vaccine. Advised may also receive vaccine at local pharmacy or Health Dept. Verbalized acceptance and understanding.  Screening Tests Health Maintenance  Topic Date Due   Hepatitis C Screening  Never done   INFLUENZA VACCINE  09/08/2021   COLONOSCOPY (Pts 45-4yr Insurance coverage will need to be confirmed)  02/03/2022 (Originally 10/29/2015)   COVID-19 Vaccine (4 - Pfizer risk series) 02/03/2022 (Originally 04/15/2020)  Zoster Vaccines- Shingrix (1 of 2) 02/03/2022 (Originally 09/16/1974)   MAMMOGRAM  01/01/2023   TETANUS/TDAP  09/03/2024   Pneumonia Vaccine 34+ Years old  Completed   DEXA SCAN  Completed   HPV VACCINES  Aged Out    Health Maintenance  Health Maintenance Due  Topic Date Due   Hepatitis C Screening  Never done    INFLUENZA VACCINE  09/08/2021    Colorectal cancer screening: Referral to GI placed 09/11/2021. Pt aware the office will call re: appt.  Mammogram status: Completed 12/31/2020. Repeat every year  Bone Density status: Completed 06/18/2020.   Lung Cancer Screening: (Low Dose CT Chest recommended if Age 60-80 years, 30 pack-year currently smoking OR have quit w/in 15years.) does not qualify.   Lung Cancer Screening Referral: no  Additional Screening:  Hepatitis C Screening: does qualify;   Vision Screening: Recommended annual ophthalmology exams for early detection of glaucoma and other disorders of the eye. Is the patient up to date with their annual eye exam?  Yes  Who is the provider or what is the name of the office in which the patient attends annual eye exams? Can't remember name If pt is not established with a provider, would they like to be referred to a provider to establish care? No .   Dental Screening: Recommended annual dental exams for proper oral hygiene  Community Resource Referral / Chronic Care Management: CRR required this visit?  No   CCM required this visit?  No      Plan:     I have personally reviewed and noted the following in the patient's chart:   Medical and social history Use of alcohol, tobacco or illicit drugs  Current medications and supplements including opioid prescriptions. Patient is not currently taking opioid prescriptions. Functional ability and status Nutritional status Physical activity Advanced directives List of other physicians Hospitalizations, surgeries, and ER visits in previous 12 months Vitals Screenings to include cognitive, depression, and falls Referrals and appointments  In addition, I have reviewed and discussed with patient certain preventive protocols, quality metrics, and best practice recommendations. A written personalized care plan for preventive services as well as general preventive health recommendations were  provided to patient.     Kellie Simmering, LPN   05/10/270   Nurse Notes: none

## 2021-09-29 NOTE — Patient Instructions (Signed)
Lynn Griffin , Thank you for taking time to come for your Medicare Wellness Visit. I appreciate your ongoing commitment to your health goals. Please review the following plan we discussed and let me know if I can assist you in the future.   Screening recommendations/referrals: Colonoscopy: has number to call if does not here in next week or two Mammogram: completed 12/31/2020, due 01/01/2022 Bone Density: completed 06/18/2020 Recommended yearly ophthalmology/optometry visit for glaucoma screening and checkup Recommended yearly dental visit for hygiene and checkup  Vaccinations: Influenza vaccine: due Pneumococcal vaccine: completed 08/04/2021 Tdap vaccine: completed 09/04/2014, due 09/03/2024 Shingles vaccine: discussed   Covid-19: 02/19/2020, 06/18/2019, 05/21/2019  Advanced directives: copy in chart  Conditions/risks identified: none  Next appointment: Follow up in one year for your annual wellness visit    Preventive Care 4 Years and Older, Female Preventive care refers to lifestyle choices and visits with your health care provider that can promote health and wellness. What does preventive care include? A yearly physical exam. This is also called an annual well check. Dental exams once or twice a year. Routine eye exams. Ask your health care provider how often you should have your eyes checked. Personal lifestyle choices, including: Daily care of your teeth and gums. Regular physical activity. Eating a healthy diet. Avoiding tobacco and drug use. Limiting alcohol use. Practicing safe sex. Taking low-dose aspirin every day. Taking vitamin and mineral supplements as recommended by your health care provider. What happens during an annual well check? The services and screenings done by your health care provider during your annual well check will depend on your age, overall health, lifestyle risk factors, and family history of disease. Counseling  Your health care provider may ask you  questions about your: Alcohol use. Tobacco use. Drug use. Emotional well-being. Home and relationship well-being. Sexual activity. Eating habits. History of falls. Memory and ability to understand (cognition). Work and work Statistician. Reproductive health. Screening  You may have the following tests or measurements: Height, weight, and BMI. Blood pressure. Lipid and cholesterol levels. These may be checked every 5 years, or more frequently if you are over 48 years old. Skin check. Lung cancer screening. You may have this screening every year starting at age 24 if you have a 30-pack-year history of smoking and currently smoke or have quit within the past 15 years. Fecal occult blood test (FOBT) of the stool. You may have this test every year starting at age 61. Flexible sigmoidoscopy or colonoscopy. You may have a sigmoidoscopy every 5 years or a colonoscopy every 10 years starting at age 50. Hepatitis C blood test. Hepatitis B blood test. Sexually transmitted disease (STD) testing. Diabetes screening. This is done by checking your blood sugar (glucose) after you have not eaten for a while (fasting). You may have this done every 1-3 years. Bone density scan. This is done to screen for osteoporosis. You may have this done starting at age 58. Mammogram. This may be done every 1-2 years. Talk to your health care provider about how often you should have regular mammograms. Talk with your health care provider about your test results, treatment options, and if necessary, the need for more tests. Vaccines  Your health care provider may recommend certain vaccines, such as: Influenza vaccine. This is recommended every year. Tetanus, diphtheria, and acellular pertussis (Tdap, Td) vaccine. You may need a Td booster every 10 years. Zoster vaccine. You may need this after age 76. Pneumococcal 13-valent conjugate (PCV13) vaccine. One dose is recommended after  age 60. Pneumococcal polysaccharide  (PPSV23) vaccine. One dose is recommended after age 66. Talk to your health care provider about which screenings and vaccines you need and how often you need them. This information is not intended to replace advice given to you by your health care provider. Make sure you discuss any questions you have with your health care provider. Document Released: 02/21/2015 Document Revised: 10/15/2015 Document Reviewed: 11/26/2014 Elsevier Interactive Patient Education  2017 [place] Prevention in the Home Falls can cause injuries. They can happen to people of all ages. There are many things you can do to make your home safe and to help prevent falls. What can I do on the outside of my home? Regularly fix the edges of walkways and driveways and fix any cracks. Remove anything that might make you trip as you walk through a door, such as a raised step or threshold. Trim any bushes or trees on the path to your home. Use bright outdoor lighting. Clear any walking paths of anything that might make someone trip, such as rocks or tools. Regularly check to see if handrails are loose or broken. Make sure that both sides of any steps have handrails. Any raised decks and porches should have guardrails on the edges. Have any leaves, snow, or ice cleared regularly. Use sand or salt on walking paths during winter. Clean up any spills in your garage right away. This includes oil or grease spills. What can I do in the bathroom? Use night lights. Install grab bars by the toilet and in the tub and shower. Do not use towel bars as grab bars. Use non-skid mats or decals in the tub or shower. If you need to sit down in the shower, use a plastic, non-slip stool. Keep the floor dry. Clean up any water that spills on the floor as soon as it happens. Remove soap buildup in the tub or shower regularly. Attach bath mats securely with double-sided non-slip rug tape. Do not have throw rugs and other things on the  floor that can make you trip. What can I do in the bedroom? Use night lights. Make sure that you have a light by your bed that is easy to reach. Do not use any sheets or blankets that are too big for your bed. They should not hang down onto the floor. Have a firm chair that has side arms. You can use this for support while you get dressed. Do not have throw rugs and other things on the floor that can make you trip. What can I do in the kitchen? Clean up any spills right away. Avoid walking on wet floors. Keep items that you use a lot in easy-to-reach places. If you need to reach something above you, use a strong step stool that has a grab bar. Keep electrical cords out of the way. Do not use floor polish or wax that makes floors slippery. If you must use wax, use non-skid floor wax. Do not have throw rugs and other things on the floor that can make you trip. What can I do with my stairs? Do not leave any items on the stairs. Make sure that there are handrails on both sides of the stairs and use them. Fix handrails that are broken or loose. Make sure that handrails are as long as the stairways. Check any carpeting to make sure that it is firmly attached to the stairs. Fix any carpet that is loose or worn. Avoid having throw rugs  at the top or bottom of the stairs. If you do have throw rugs, attach them to the floor with carpet tape. Make sure that you have a light switch at the top of the stairs and the bottom of the stairs. If you do not have them, ask someone to add them for you. What else can I do to help prevent falls? Wear shoes that: Do not have high heels. Have rubber bottoms. Are comfortable and fit you well. Are closed at the toe. Do not wear sandals. If you use a stepladder: Make sure that it is fully opened. Do not climb a closed stepladder. Make sure that both sides of the stepladder are locked into place. Ask someone to hold it for you, if possible. Clearly mark and make  sure that you can see: Any grab bars or handrails. First and last steps. Where the edge of each step is. Use tools that help you move around (mobility aids) if they are needed. These include: Canes. Walkers. Scooters. Crutches. Turn on the lights when you go into a dark area. Replace any light bulbs as soon as they burn out. Set up your furniture so you have a clear path. Avoid moving your furniture around. If any of your floors are uneven, fix them. If there are any pets around you, be aware of where they are. Review your medicines with your doctor. Some medicines can make you feel dizzy. This can increase your chance of falling. Ask your doctor what other things that you can do to help prevent falls. This information is not intended to replace advice given to you by your health care provider. Make sure you discuss any questions you have with your health care provider. Document Released: 11/21/2008 Document Revised: 07/03/2015 Document Reviewed: 03/01/2014 Elsevier Interactive Patient Education  2017 [employer].

## 2021-11-02 ENCOUNTER — Encounter: Payer: Self-pay | Admitting: Family Medicine

## 2021-11-02 ENCOUNTER — Ambulatory Visit (INDEPENDENT_AMBULATORY_CARE_PROVIDER_SITE_OTHER): Payer: PPO | Admitting: Family Medicine

## 2021-11-02 VITALS — BP 100/68 | HR 70 | Temp 98.1°F | Wt 125.5 lb

## 2021-11-02 DIAGNOSIS — H00015 Hordeolum externum left lower eyelid: Secondary | ICD-10-CM | POA: Diagnosis not present

## 2021-11-02 MED ORDER — ERYTHROMYCIN 5 MG/GM OP OINT: 1.0000 | TOPICAL_OINTMENT | Freq: Four times a day (QID) | OPHTHALMIC | 0 refills | Status: DC

## 2021-11-02 NOTE — Progress Notes (Signed)
   Subjective:    Patient ID: Lynn Griffin, female    DOB: May 05, 1955, 66 y.o.   MRN: [id number]  HPI Here for 5 days of a tender spot on the left lower eyelid. At first it was a bump that then opened and drained purulent fluid. Now it is dry but still sore. She has been using warm compresses.    Review of Systems  Constitutional: Negative.   HENT: Negative.    Eyes:  Positive for discharge. Negative for redness, itching and visual disturbance.  Respiratory: Negative.         Objective:   Physical Exam Constitutional:      Appearance: Normal appearance.  HENT:     Right Ear: Tympanic membrane, ear canal and external ear normal.     Left Ear: Tympanic membrane, ear canal and external ear normal.     Nose: Nose normal.     Mouth/Throat:     Pharynx: Oropharynx is clear.  Eyes:     Conjunctiva/sclera: Conjunctivae normal.     Pupils: Pupils are equal, round, and reactive to light.     Comments: The left lower eyelid has an area of erythema and crusting which is tender   Lymphadenopathy:     Cervical: No cervical adenopathy.  Neurological:     Mental Status: She is alert.           Assessment & Plan:  Stye, treat with Erythromycin ointment until clear.  Alysia Penna, MD

## 2021-11-13 DIAGNOSIS — Z808 Family history of malignant neoplasm of other organs or systems: Secondary | ICD-10-CM | POA: Diagnosis not present

## 2021-11-13 DIAGNOSIS — D224 Melanocytic nevi of scalp and neck: Secondary | ICD-10-CM | POA: Diagnosis not present

## 2021-11-13 DIAGNOSIS — Z86018 Personal history of other benign neoplasm: Secondary | ICD-10-CM | POA: Diagnosis not present

## 2021-11-13 DIAGNOSIS — D171 Benign lipomatous neoplasm of skin and subcutaneous tissue of trunk: Secondary | ICD-10-CM | POA: Diagnosis not present

## 2021-11-13 DIAGNOSIS — Z86006 Personal history of melanoma in-situ: Secondary | ICD-10-CM | POA: Diagnosis not present

## 2021-11-13 DIAGNOSIS — D2271 Melanocytic nevi of right lower limb, including hip: Secondary | ICD-10-CM | POA: Diagnosis not present

## 2021-11-13 DIAGNOSIS — D225 Melanocytic nevi of trunk: Secondary | ICD-10-CM | POA: Diagnosis not present

## 2021-11-13 DIAGNOSIS — L578 Other skin changes due to chronic exposure to nonionizing radiation: Secondary | ICD-10-CM | POA: Diagnosis not present

## 2021-11-13 DIAGNOSIS — L570 Actinic keratosis: Secondary | ICD-10-CM | POA: Diagnosis not present

## 2021-11-13 DIAGNOSIS — L821 Other seborrheic keratosis: Secondary | ICD-10-CM | POA: Diagnosis not present

## 2021-12-09 DIAGNOSIS — M25561 Pain in right knee: Secondary | ICD-10-CM | POA: Diagnosis not present

## 2021-12-25 DIAGNOSIS — M25561 Pain in right knee: Secondary | ICD-10-CM | POA: Diagnosis not present

## 2022-01-01 ENCOUNTER — Encounter: Payer: Self-pay | Admitting: Internal Medicine

## 2022-01-01 DIAGNOSIS — Z1211 Encounter for screening for malignant neoplasm of colon: Secondary | ICD-10-CM

## 2022-01-04 DIAGNOSIS — M25561 Pain in right knee: Secondary | ICD-10-CM | POA: Diagnosis not present

## 2022-01-05 NOTE — Telephone Encounter (Signed)
Please order cologuard  for colon cancer screening

## 2022-01-13 DIAGNOSIS — H524 Presbyopia: Secondary | ICD-10-CM | POA: Diagnosis not present

## 2022-01-13 DIAGNOSIS — H2512 Age-related nuclear cataract, left eye: Secondary | ICD-10-CM | POA: Diagnosis not present

## 2022-02-03 NOTE — Addendum Note (Signed)
Addended by: Elza Rafter D on: 02/03/2022 01:46 PM   Modules accepted: Orders

## 2022-03-03 ENCOUNTER — Encounter: Payer: Self-pay | Admitting: Internal Medicine

## 2022-05-27 DIAGNOSIS — D225 Melanocytic nevi of trunk: Secondary | ICD-10-CM | POA: Diagnosis not present

## 2022-05-27 DIAGNOSIS — Z86018 Personal history of other benign neoplasm: Secondary | ICD-10-CM | POA: Diagnosis not present

## 2022-05-27 DIAGNOSIS — L821 Other seborrheic keratosis: Secondary | ICD-10-CM | POA: Diagnosis not present

## 2022-05-27 DIAGNOSIS — D224 Melanocytic nevi of scalp and neck: Secondary | ICD-10-CM | POA: Diagnosis not present

## 2022-05-27 DIAGNOSIS — D171 Benign lipomatous neoplasm of skin and subcutaneous tissue of trunk: Secondary | ICD-10-CM | POA: Diagnosis not present

## 2022-05-27 DIAGNOSIS — L578 Other skin changes due to chronic exposure to nonionizing radiation: Secondary | ICD-10-CM | POA: Diagnosis not present

## 2022-05-27 DIAGNOSIS — Z86006 Personal history of melanoma in-situ: Secondary | ICD-10-CM | POA: Diagnosis not present

## 2022-05-27 DIAGNOSIS — Z808 Family history of malignant neoplasm of other organs or systems: Secondary | ICD-10-CM | POA: Diagnosis not present

## 2022-05-27 DIAGNOSIS — L570 Actinic keratosis: Secondary | ICD-10-CM | POA: Diagnosis not present

## 2022-05-27 DIAGNOSIS — D2271 Melanocytic nevi of right lower limb, including hip: Secondary | ICD-10-CM | POA: Diagnosis not present

## 2022-06-17 DIAGNOSIS — H2513 Age-related nuclear cataract, bilateral: Secondary | ICD-10-CM | POA: Diagnosis not present

## 2022-07-15 DIAGNOSIS — Z1211 Encounter for screening for malignant neoplasm of colon: Secondary | ICD-10-CM | POA: Diagnosis not present

## 2022-07-21 LAB — COLOGUARD: COLOGUARD: NEGATIVE

## 2022-07-23 IMAGING — MG MM DIGITAL SCREENING BILAT W/ TOMO AND CAD
8 series · 9 of 24 positions shown · non-contrast
Comparison: Previous exam(s).

CLINICAL DATA: Screening.

EXAM:
DIGITAL SCREENING BILATERAL MAMMOGRAM WITH TOMOSYNTHESIS AND CAD
TECHNIQUE: Bilateral screening digital craniocaudal and mediolateral oblique
mammograms were obtained. Bilateral screening digital breast
tomosynthesis was performed. The images were evaluated with
computer-aided detection.

[R CC synth-2D]
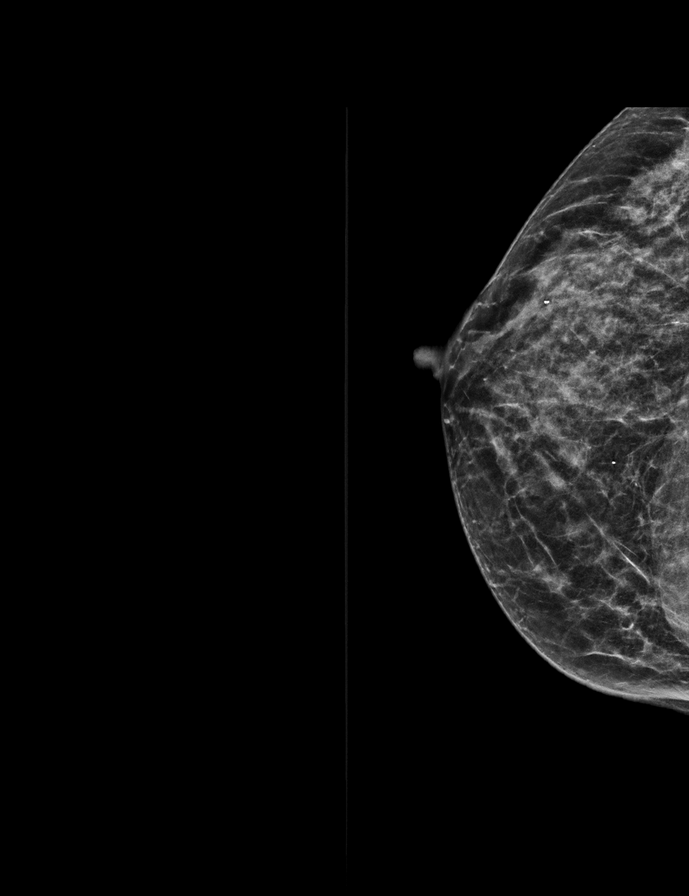

[L CC synth-2D]
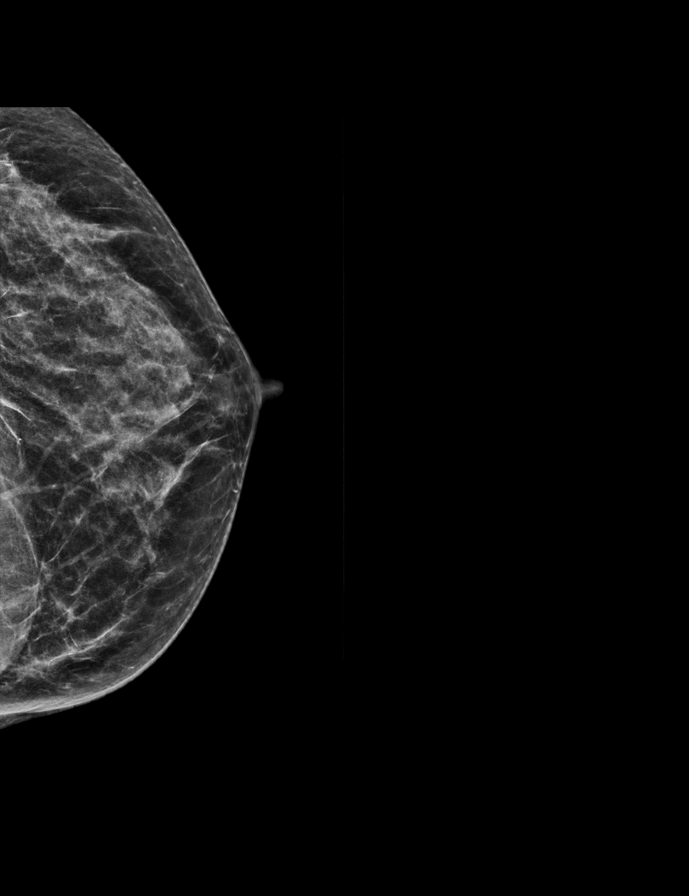

[L MLO synth-2D]
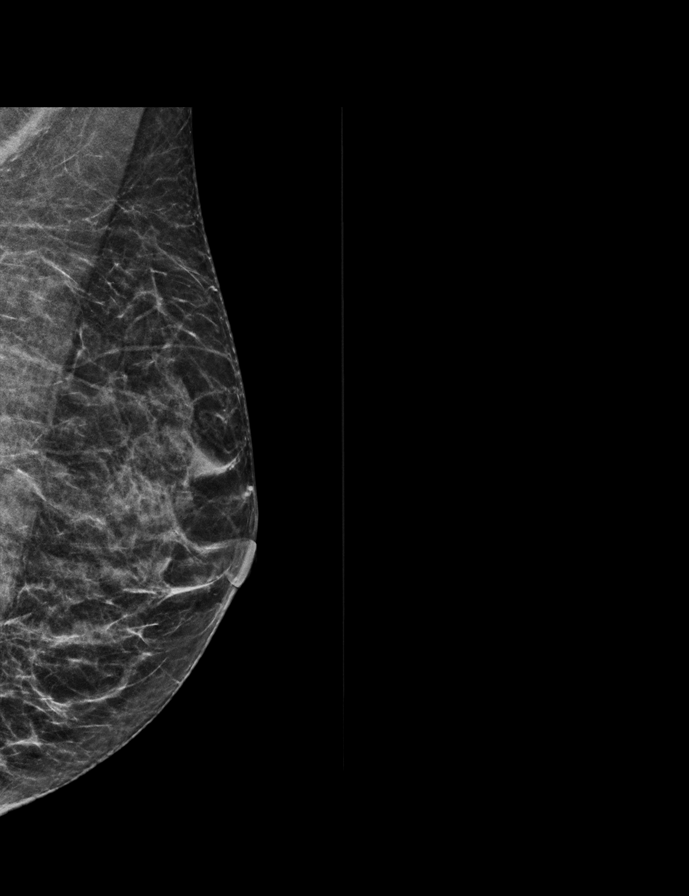

[R MLO synth-2D]
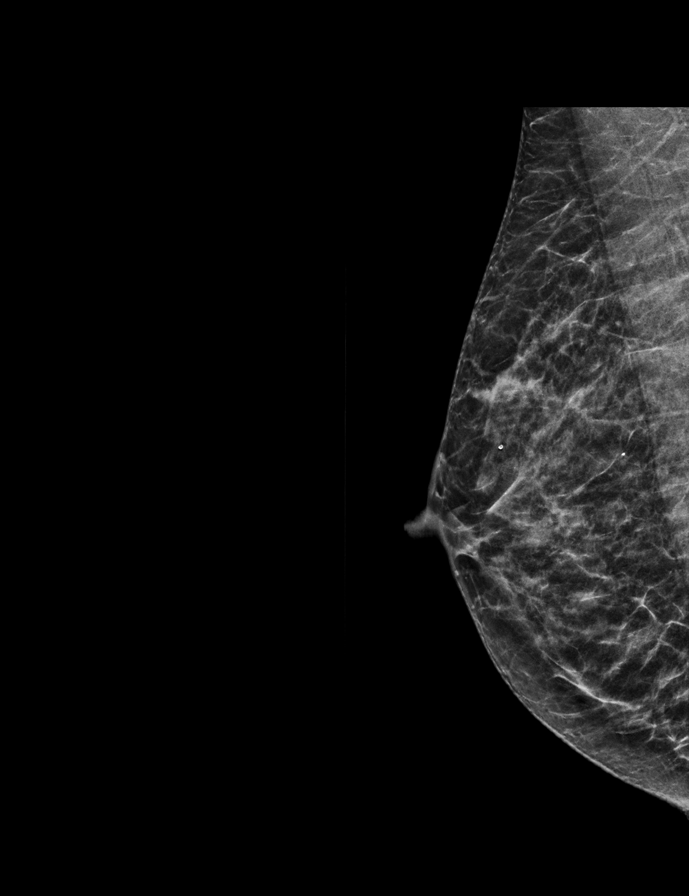

[L MLO tomo · 2 of 43 frames shown]
[frame 14/43]
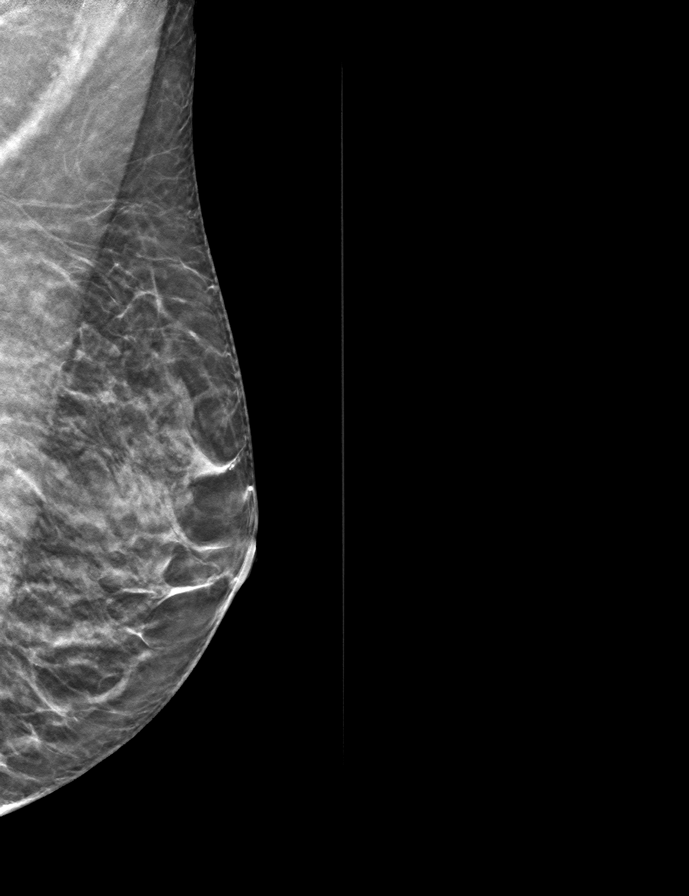
[frame 22/43]
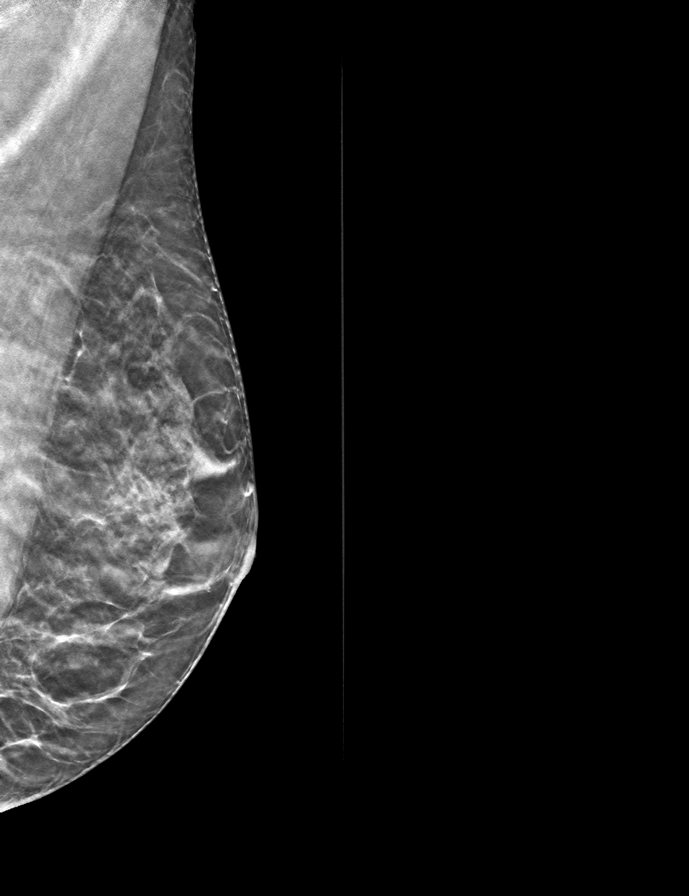

[L CC tomo · tomo slice 23/44.0]
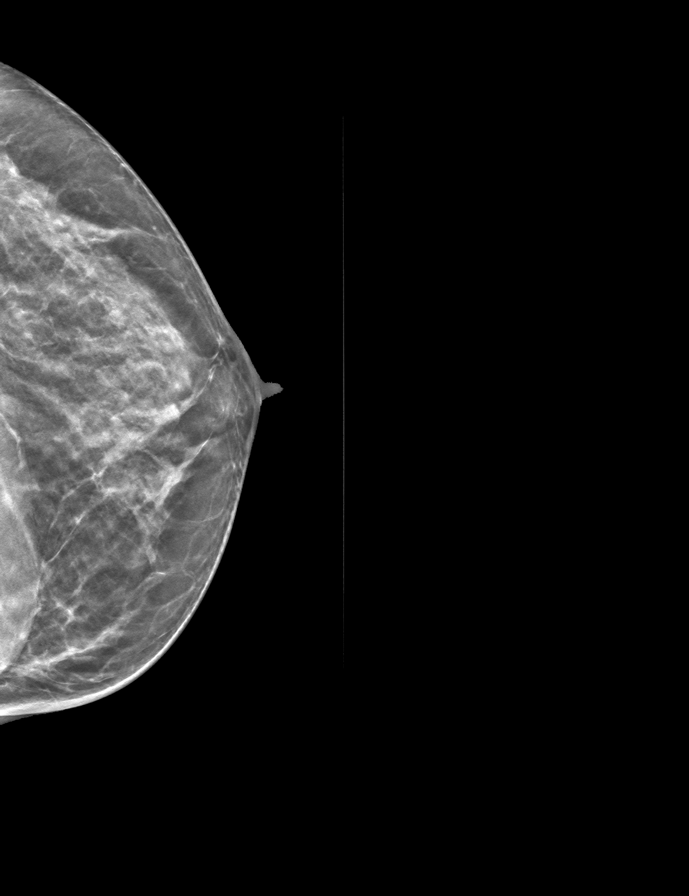

[R MLO tomo · tomo slice 22/43.0]
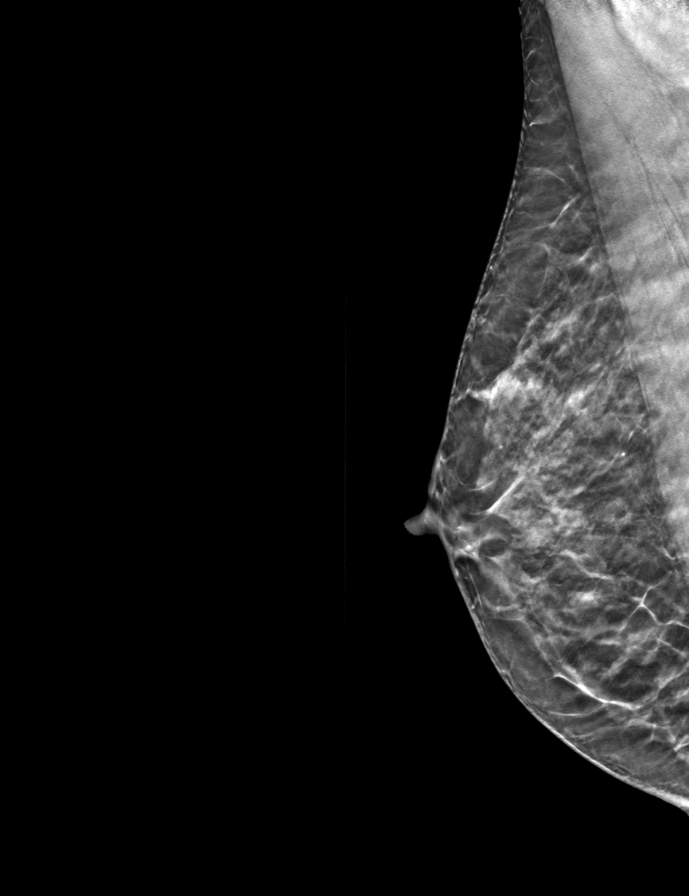

[R CC tomo · tomo slice 21/42.0]
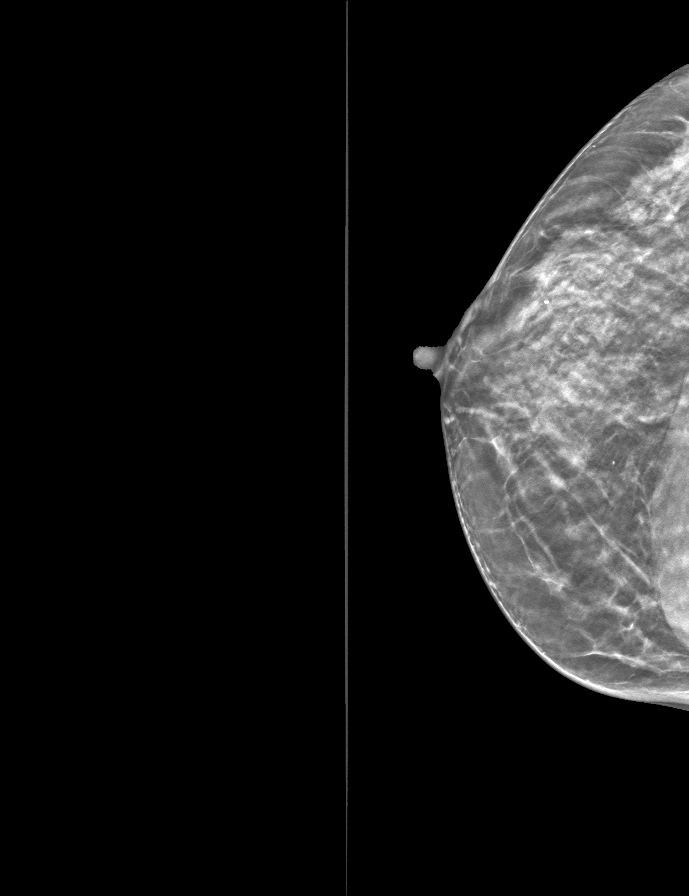

[9 of 24 positions shown; findings below may reference images not displayed]

ACR Breast Density Category c: The breast tissue is heterogeneously
dense, which may obscure small masses.
FINDINGS: There are no findings suspicious for malignancy.
IMPRESSION: No mammographic evidence of malignancy. A result letter of this
screening mammogram will be mailed directly to the patient.

RECOMMENDATION:
Screening mammogram in one year. (Code:[id number])

BI-RADS CATEGORY  1: Negative.

## 2022-08-10 DIAGNOSIS — H2512 Age-related nuclear cataract, left eye: Secondary | ICD-10-CM | POA: Diagnosis not present

## 2022-08-10 DIAGNOSIS — Z961 Presence of intraocular lens: Secondary | ICD-10-CM | POA: Diagnosis not present

## 2022-08-10 DIAGNOSIS — H25812 Combined forms of age-related cataract, left eye: Secondary | ICD-10-CM | POA: Diagnosis not present

## 2022-09-08 ENCOUNTER — Encounter (INDEPENDENT_AMBULATORY_CARE_PROVIDER_SITE_OTHER): Payer: Self-pay

## 2022-10-04 ENCOUNTER — Ambulatory Visit (INDEPENDENT_AMBULATORY_CARE_PROVIDER_SITE_OTHER): Payer: HMO

## 2022-10-04 VITALS — Ht 67.0 in | Wt 125.0 lb

## 2022-10-04 DIAGNOSIS — Z0000 Encounter for general adult medical examination without abnormal findings: Secondary | ICD-10-CM

## 2022-10-04 NOTE — Patient Instructions (Addendum)
Ms. Monley , Thank you for taking time to come for your Medicare Wellness Visit. I appreciate your ongoing commitment to your health goals. Please review the following plan we discussed and let me know if I can assist you in the future.   Referrals/Orders/Follow-Ups/Clinician Recommendations:   This is a list of the screening recommended for you and due dates:  Health Maintenance  Topic Date Due   Hepatitis C Screening  Never done   Zoster (Shingles) Vaccine (1 of 2) Never done   Colon Cancer Screening  10/29/2015   COVID-19 Vaccine (4 - 2023-24 season) 10/09/2021   Flu Shot  09/09/2022   Mammogram  01/01/2023   Medicare Annual Wellness Visit  10/04/2023   DTaP/Tdap/Td vaccine (3 - Td or Tdap) 09/03/2024   Pneumonia Vaccine  Completed   DEXA scan (bone density measurement)  Completed   HPV Vaccine  Aged Out    Advanced directives: (In Chart) A copy of your advanced directives are scanned into your chart should your provider ever need it.  Next Medicare Annual Wellness Visit scheduled for next year: Yes

## 2022-10-04 NOTE — Progress Notes (Signed)
Subjective:   Lynn Griffin is a 67 y.o. female who presents for Medicare Annual (Subsequent) preventive examination.  Visit Complete: Virtual  I connected with  Vilma Meckel on 10/04/22 by a audio enabled telemedicine application and verified that I am speaking with the correct person using two identifiers.  Patient Location: Home  Provider Location: Home Office  I discussed the limitations of evaluation and management by telemedicine. The patient expressed understanding and agreed to proceed.  Patient Medicare AWV questionnaire was completed by the patient on 09/30/22; I have confirmed that all information answered by patient is correct and no changes since this date.  Review of Systems    Vital Signs: Unable to obtain new vitals due to this being a telehealth visit.  Cardiac Risk Factors include: advanced age (>51men, >39 women)     Objective:    Today's Vitals   10/04/22 1339  Weight: 125 lb (56.7 kg)  Height: 5\' 7"  (1.702 m)   Body mass index is 19.58 kg/m.     10/04/2022    1:48 PM 09/29/2021   10:45 AM  Advanced Directives  Does Patient Have a Medical Advance Directive? Yes Yes  Type of Estate agent of [place];Living will Healthcare Power of [place];Living will  Does patient want to make changes to medical advance directive? No - Patient declined   Copy of Healthcare Power of Attorney in Chart? Yes - validated most recent copy scanned in chart (See row information) Yes - validated most recent copy scanned in chart (See row information)    Current Medications (verified) Outpatient Encounter Medications as of 10/04/2022  Medication Sig   Multiple Vitamins-Minerals (MULTIVITAMIN PO) Take 1 tablet by mouth daily.   Omega-3 Fatty Acids (OMEGA 3 PO) Take by mouth.   OVER THE COUNTER MEDICATION Transitions (herbal supplement for menopause)   [DISCONTINUED] DIGESTIVE ENZYMES PO Take by mouth.   [DISCONTINUED] erythromycin ophthalmic ointment  Place 1 Application into the left eye 4 (four) times daily.   [DISCONTINUED] OVER THE COUNTER MEDICATION Paranorm (for parasites)   [DISCONTINUED] Probiotic Product (PROBIOTIC DAILY PO) Take 1 tablet by mouth daily.   No facility-administered encounter medications on file as of 10/04/2022.    Allergies (verified) Patient has no known allergies.   History: Past Medical History:  Diagnosis Date   Breast cyst    Desensitization to allergy shot    hx of as a child    Exercise-induced asthma    Family history of ovarian cancer    Family history of uterine cancer    Melanoma (HCC)    Rectal bleeding 07/09/2010   Probably was beet juice     Treadmill stress test negative for angina pectoris    neg myoview 2008   Past Surgical History:  Procedure Laterality Date   childbirth x 2     hx allergy shots     left knee reconstruction  1989   MELANOMA EXCISION  04/19/2019   myoview stess     negative 2008   Family History  Problem Relation Age of Onset   Anemia Mother    Parkinsonism Mother 87   Coronary artery disease Father 36   Ovarian cancer Sister 61       d. 63   Uterine cancer Sister 41   Healthy Sister    Healthy Brother    Ovarian cancer Maternal Grandmother    Social History   Socioeconomic History   Marital status: Married    Spouse name: Not  on file   Number of children: Not on file   Years of education: Not on file   Highest education level: Not on file  Occupational History   Not on file  Tobacco Use   Smoking status: Former   Smokeless tobacco: Never  Vaping Use   Vaping status: Never Used  Substance and Sexual Activity   Alcohol use: Yes    Comment: occasionally    Drug use: No   Sexual activity: Yes    Birth control/protection: Post-menopausal  Other Topics Concern   Not on file  Social History Narrative   Married   Former smoker   Regular Exercise- yes   hh of 2     1 cats   Child biirth x 2          Social Determinants of Health    Financial Resource Strain: Low Risk  (09/30/2022)   Overall Financial Resource Strain (CARDIA)    Difficulty of Paying Living Expenses: Not hard at all  Food Insecurity: No Food Insecurity (09/30/2022)   Hunger Vital Sign    Worried About Running Out of Food in the Last Year: Never true    Ran Out of Food in the Last Year: Never true  Transportation Needs: No Transportation Needs (09/30/2022)   PRAPARE - Transportation    Lack of Transportation (Medical): No    Lack of Transportation (Non-Medical): No  Physical Activity: Sufficiently Active (09/30/2022)   Exercise Vital Sign    Days of Exercise per Week: 4 days    Minutes of Exercise per Session: 40 min  Stress: Stress Concern Present (09/30/2022)   [employer] of Occupational Health - Occupational Stress Questionnaire    Feeling of Stress : To some extent  Social Connections: Unknown (09/30/2022)   Social Connection and Isolation Panel [NHANES]    Frequency of Communication with Friends and Family: Twice a week    Frequency of Social Gatherings with Friends and Family: Three times a week    Attends Religious Services: Patient declined    Active Member of Clubs or Organizations: No    Attends Engineer, structural: Patient declined    Marital Status: Married    Tobacco Counseling Counseling given: Not Answered   Clinical Intake:  Pre-visit preparation completed: Yes  Pain : No/denies pain     BMI - recorded: 19.58 Nutritional Status: BMI of 19-24  Normal Nutritional Risks: None Diabetes: No  How often do you need to have someone help you when you read instructions, pamphlets, or other written materials from your doctor or pharmacy?: 1 - Never  Interpreter Needed?: No  Information entered by :: Theresa Mulligan LPN   Activities of Daily Living    09/30/2022    9:15 AM  In your present state of health, do you have any difficulty performing the following activities:  Hearing? 0  Vision? 0  Difficulty  concentrating or making decisions? 1  Walking or climbing stairs? 0  Dressing or bathing? 0  Doing errands, shopping? 0  Preparing Food and eating ? N  Using the Toilet? N  In the past six months, have you accidently leaked urine? N  Do you have problems with loss of bowel control? N  Managing your Medications? N  Managing your Finances? N  Housekeeping or managing your Housekeeping? N    Patient Care Team: Panosh, Neta Mends, MD as PCP - Juanna Cao, MD as Attending Physician (Dermatology) Elbert Ewings, MD (Obstetrics and Gynecology)  Indicate any recent  Medical Services you may have received from other than [facility] providers in the past year (date may be approximate).     Assessment:   This is a routine wellness examination for Vonceil.  Hearing/Vision screen Hearing Screening - Comments:: Denies hearing difficulties   Vision Screening - Comments:: Wears reading glasses - up to date with routine eye exams with  Dr Randon Goldsmith  Dietary issues and exercise activities discussed:     Goals Addressed               This Visit's Progress     Keep memory sharp (pt-stated)         Depression Screen    10/04/2022    1:44 PM 11/02/2021    2:56 PM 09/29/2021   10:46 AM 08/04/2021    1:47 PM 07/22/2020    3:50 PM 06/08/2019    9:35 AM 12/02/2017   10:01 AM  PHQ 2/9 Scores  PHQ - 2 Score 0 4 0 2 2 0 0  PHQ- 9 Score  8  4 8  0     Fall Risk    09/30/2022    9:15 AM 11/02/2021    2:56 PM 11/02/2021   10:24 AM 09/29/2021   10:45 AM 08/04/2021    1:47 PM  Fall Risk   Falls in the past year? 0 0 0 0 0  Number falls in past yr: 0 0  0 0  Injury with Fall? 0 0  0 0  Risk for fall due to :  No Fall Risks  No Fall Risks No Fall Risks  Follow up  Falls evaluation completed  Falls evaluation completed;Education provided;Falls prevention discussed Falls prevention discussed    MEDICARE RISK AT HOME: Medicare Risk at Home Any stairs in or around the home?: No If so, are there any  without handrails?: No Home free of loose throw rugs in walkways, pet beds, electrical cords, etc?: Yes Adequate lighting in your home to reduce risk of falls?: Yes Life alert?: No Use of a cane, walker or w/c?: No Grab bars in the bathroom?: No Shower chair or bench in shower?: No Elevated toilet seat or a handicapped toilet?: No  TIMED UP AND GO:  Was the test performed?  No    Cognitive Function:        10/04/2022    1:48 PM 09/29/2021   10:50 AM  6CIT Screen  What Year? 0 points 0 points  What month? 0 points 0 points  What time? 0 points 0 points  Count back from 20 0 points 0 points  Months in reverse 0 points 0 points  Repeat phrase 0 points 6 points  Total Score 0 points 6 points    Immunizations Immunization History  Administered Date(s) Administered   Fluad Quad(high Dose 65+) 12/08/2020   Influenza Whole 12/31/2008, 02/06/2010   Influenza,inj,Quad PF,6+ Mos 10/23/2013, 10/15/2015   PFIZER(Purple Top)SARS-COV-2 Vaccination 05/21/2019, 06/18/2019, 02/19/2020   PNEUMOCOCCAL CONJUGATE-20 08/04/2021   Td 06/22/2005   Tdap 09/04/2014    TDAP status: Up to date  Flu Vaccine status: Due, Education has been provided regarding the importance of this vaccine. Advised may receive this vaccine at local pharmacy or Health Dept. Aware to provide a copy of the vaccination record if obtained from local pharmacy or Health Dept. Verbalized acceptance and understanding.  Pneumococcal vaccine status: Up to date  Covid-19 vaccine status: Declined, Education has been provided regarding the importance of this vaccine but patient still declined. Advised may receive this  vaccine at local pharmacy or Health Dept.or vaccine clinic. Aware to provide a copy of the vaccination record if obtained from local pharmacy or Health Dept. Verbalized acceptance and understanding.  Qualifies for Shingles Vaccine? Yes   Zostavax completed No   Shingrix Completed?: No.    Education has been  provided regarding the importance of this vaccine. Patient has been advised to call insurance company to determine out of pocket expense if they have not yet received this vaccine. Advised may also receive vaccine at local pharmacy or Health Dept. Verbalized acceptance and understanding.  Screening Tests Health Maintenance  Topic Date Due   Hepatitis C Screening  Never done   Zoster Vaccines- Shingrix (1 of 2) Never done   Colonoscopy  10/29/2015   COVID-19 Vaccine (4 - 2023-24 season) 10/09/2021   INFLUENZA VACCINE  09/09/2022   MAMMOGRAM  01/01/2023   Medicare Annual Wellness (AWV)  10/04/2023   DTaP/Tdap/Td (3 - Td or Tdap) 09/03/2024   Pneumonia Vaccine 15+ Years old  Completed   DEXA SCAN  Completed   HPV VACCINES  Aged Out    Health Maintenance  Health Maintenance Due  Topic Date Due   Hepatitis C Screening  Never done   Zoster Vaccines- Shingrix (1 of 2) Never done   Colonoscopy  10/29/2015   COVID-19 Vaccine (4 - 2023-24 season) 10/09/2021   INFLUENZA VACCINE  09/09/2022    Colorectal cancer screening: Referral to GI placed Deferred. Pt aware the office will call re: appt.  Mammogram status: Completed 12/31/20. Repeat every year  Bone Density status: Completed 06/18/20. Results reflect: Bone density results: OSTEOPOROSIS. Repeat every   years.  Lung Cancer Screening: (Low Dose CT Chest recommended if Age 21-80 years, 20 pack-year currently smoking OR have quit w/in 15years.) does not qualify.     Additional Screening:  Hepatitis C Screening: does qualify;  Deferred  Vision Screening: Recommended annual ophthalmology exams for early detection of glaucoma and other disorders of the eye. Is the patient up to date with their annual eye exam?  Yes  Who is the provider or what is the name of the office in which the patient attends annual eye exams? Dr Randon Goldsmith If pt is not established with a provider, would they like to be referred to a provider to establish care? No .    Dental Screening: Recommended annual dental exams for proper oral hygiene    Community Resource Referral / Chronic Care Management:  CRR required this visit?  No   CCM required this visit?  No     Plan:     I have personally reviewed and noted the following in the patient's chart:   Medical and social history Use of alcohol, tobacco or illicit drugs  Current medications and supplements including opioid prescriptions. Patient is not currently taking opioid prescriptions. Functional ability and status Nutritional status Physical activity Advanced directives List of other physicians Hospitalizations, surgeries, and ER visits in previous 12 months Vitals Screenings to include cognitive, depression, and falls Referrals and appointments  In addition, I have reviewed and discussed with patient certain preventive protocols, quality metrics, and best practice recommendations. A written personalized care plan for preventive services as well as general preventive health recommendations were provided to patient.     Tillie Rung, LPN   [id number]/52/[id number]   After Visit Summary: (MyChart) Due to this being a telephonic visit, the after visit summary with patients personalized plan was offered to patient via MyChart   Nurse Notes: None

## 2022-12-07 DIAGNOSIS — M25511 Pain in right shoulder: Secondary | ICD-10-CM | POA: Diagnosis not present

## 2022-12-09 DIAGNOSIS — Z86018 Personal history of other benign neoplasm: Secondary | ICD-10-CM | POA: Diagnosis not present

## 2022-12-09 DIAGNOSIS — L821 Other seborrheic keratosis: Secondary | ICD-10-CM | POA: Diagnosis not present

## 2022-12-09 DIAGNOSIS — Z808 Family history of malignant neoplasm of other organs or systems: Secondary | ICD-10-CM | POA: Diagnosis not present

## 2022-12-09 DIAGNOSIS — D2271 Melanocytic nevi of right lower limb, including hip: Secondary | ICD-10-CM | POA: Diagnosis not present

## 2022-12-09 DIAGNOSIS — L578 Other skin changes due to chronic exposure to nonionizing radiation: Secondary | ICD-10-CM | POA: Diagnosis not present

## 2022-12-09 DIAGNOSIS — D171 Benign lipomatous neoplasm of skin and subcutaneous tissue of trunk: Secondary | ICD-10-CM | POA: Diagnosis not present

## 2022-12-09 DIAGNOSIS — L570 Actinic keratosis: Secondary | ICD-10-CM | POA: Diagnosis not present

## 2022-12-09 DIAGNOSIS — D225 Melanocytic nevi of trunk: Secondary | ICD-10-CM | POA: Diagnosis not present

## 2022-12-09 DIAGNOSIS — D224 Melanocytic nevi of scalp and neck: Secondary | ICD-10-CM | POA: Diagnosis not present

## 2022-12-09 DIAGNOSIS — Z86006 Personal history of melanoma in-situ: Secondary | ICD-10-CM | POA: Diagnosis not present

## 2022-12-20 DIAGNOSIS — S46011D Strain of muscle(s) and tendon(s) of the rotator cuff of right shoulder, subsequent encounter: Secondary | ICD-10-CM | POA: Diagnosis not present

## 2022-12-25 ENCOUNTER — Encounter: Payer: Self-pay | Admitting: Internal Medicine

## 2022-12-25 DIAGNOSIS — Z77011 Contact with and (suspected) exposure to lead: Secondary | ICD-10-CM

## 2022-12-25 DIAGNOSIS — E559 Vitamin D deficiency, unspecified: Secondary | ICD-10-CM

## 2022-12-25 DIAGNOSIS — E785 Hyperlipidemia, unspecified: Secondary | ICD-10-CM

## 2022-12-25 DIAGNOSIS — Z79899 Other long term (current) drug therapy: Secondary | ICD-10-CM

## 2022-12-27 NOTE — Telephone Encounter (Signed)
Attempted to reach pt. Left a voicemail to call us back.  

## 2022-12-27 NOTE — Telephone Encounter (Signed)
So  we could order  blood  work  but need more information in case need other  specific testing  .  Also  since a public health issue consider also contact the   health department  and? If water department   .   Could make a virtual appt  to discuss  and order .

## 2022-12-29 NOTE — Telephone Encounter (Signed)
Attempted to reach pt. Left a voicemail to call us back.  

## 2022-12-30 DIAGNOSIS — S46011D Strain of muscle(s) and tendon(s) of the rotator cuff of right shoulder, subsequent encounter: Secondary | ICD-10-CM | POA: Diagnosis not present

## 2023-01-04 ENCOUNTER — Encounter: Payer: Self-pay | Admitting: Internal Medicine

## 2023-01-04 NOTE — Telephone Encounter (Signed)
I placed future orders for upcoming visit that included a blood lead level vit d and iron studies

## 2023-01-04 NOTE — Telephone Encounter (Signed)
Pt called to say she has a CPE scheduled for 02/08/23. Pt would like to request labs, prior to CPE.  Pt states she already called her insurance, and was told the labs would be covered.  Please return Pt's call, at your earliest convenience.

## 2023-01-13 DIAGNOSIS — S46011D Strain of muscle(s) and tendon(s) of the rotator cuff of right shoulder, subsequent encounter: Secondary | ICD-10-CM | POA: Diagnosis not present

## 2023-01-17 NOTE — Telephone Encounter (Signed)
Pt's labs have been scheduled for Friday (02/04/23)

## 2023-01-25 DIAGNOSIS — S46011D Strain of muscle(s) and tendon(s) of the rotator cuff of right shoulder, subsequent encounter: Secondary | ICD-10-CM | POA: Diagnosis not present

## 2023-02-04 ENCOUNTER — Other Ambulatory Visit (INDEPENDENT_AMBULATORY_CARE_PROVIDER_SITE_OTHER): Payer: HMO

## 2023-02-04 DIAGNOSIS — E785 Hyperlipidemia, unspecified: Secondary | ICD-10-CM | POA: Diagnosis not present

## 2023-02-04 DIAGNOSIS — E559 Vitamin D deficiency, unspecified: Secondary | ICD-10-CM | POA: Diagnosis not present

## 2023-02-04 DIAGNOSIS — Z79899 Other long term (current) drug therapy: Secondary | ICD-10-CM

## 2023-02-04 DIAGNOSIS — Z77011 Contact with and (suspected) exposure to lead: Secondary | ICD-10-CM | POA: Diagnosis not present

## 2023-02-04 LAB — IBC + FERRITIN
Ferritin: 43.4 ng/mL (ref 10.0–291.0)
Iron: 120 ug/dL (ref 42–145)
Saturation Ratios: 37.8 % (ref 20.0–50.0)
TIBC: 317.8 ug/dL (ref 250.0–450.0)
Transferrin: 227 mg/dL (ref 212.0–360.0)

## 2023-02-04 LAB — LIPID PANEL
Cholesterol: 284 mg/dL — ABNORMAL HIGH (ref 0–200)
HDL: 80 mg/dL (ref >39.00)
LDL Cholesterol: 183 mg/dL — ABNORMAL HIGH (ref 0–99)
NonHDL: 204.08
Total CHOL/HDL Ratio: 4
Triglycerides: 104 mg/dL (ref 0.0–149.0)
VLDL: 20.8 mg/dL (ref 0.0–40.0)

## 2023-02-04 LAB — HEPATIC FUNCTION PANEL
ALT: 32 U/L (ref 0–35)
AST: 28 U/L (ref 0–37)
Albumin: 4.1 g/dL (ref 3.5–5.2)
Alkaline Phosphatase: 65 U/L (ref 39–117)
Bilirubin, Direct: 0.1 mg/dL (ref 0.0–0.3)
Total Bilirubin: 0.4 mg/dL (ref 0.2–1.2)
Total Protein: 6.3 g/dL (ref 6.0–8.3)

## 2023-02-04 LAB — CBC WITH DIFFERENTIAL/PLATELET
Basophils Absolute: 0.1 10*3/uL (ref 0.0–0.1)
Basophils Relative: 1.5 % (ref 0.0–3.0)
Eosinophils Absolute: 0.1 10*3/uL (ref 0.0–0.7)
Eosinophils Relative: 3 % (ref 0.0–5.0)
HCT: 40.3 % (ref 36.0–46.0)
Hemoglobin: 13.5 g/dL (ref 12.0–15.0)
Lymphocytes Relative: 41 % (ref 12.0–46.0)
Lymphs Abs: 1.7 10*3/uL (ref 0.7–4.0)
MCHC: 33.6 g/dL (ref 30.0–36.0)
MCV: 95.9 fL (ref 78.0–100.0)
Monocytes Absolute: 0.3 10*3/uL (ref 0.1–1.0)
Monocytes Relative: 8.1 % (ref 3.0–12.0)
Neutro Abs: 1.9 10*3/uL (ref 1.4–7.7)
Neutrophils Relative %: 46.4 % (ref 43.0–77.0)
Platelets: 271 10*3/uL (ref 150.0–400.0)
RBC: 4.21 Mil/uL (ref 3.87–5.11)
RDW: 12.9 % (ref 11.5–15.5)
WBC: 4.2 10*3/uL (ref 4.0–10.5)

## 2023-02-04 LAB — BASIC METABOLIC PANEL
BUN: 17 mg/dL (ref 6–23)
CO2: 27 meq/L (ref 19–32)
Calcium: 8.9 mg/dL (ref 8.4–10.5)
Chloride: 105 meq/L (ref 96–112)
Creatinine, Ser: 0.92 mg/dL (ref 0.40–1.20)
GFR: 64.04 mL/min (ref >60.00)
Glucose, Bld: 88 mg/dL (ref 70–99)
Potassium: 4.1 meq/L (ref 3.5–5.1)
Sodium: 138 meq/L (ref 135–145)

## 2023-02-04 LAB — TSH: TSH: 4.48 u[IU]/mL (ref 0.35–5.50)

## 2023-02-04 LAB — VITAMIN D 25 HYDROXY (VIT D DEFICIENCY, FRACTURES): VITD: 67.2 ng/mL (ref 30.00–100.00)

## 2023-02-07 DIAGNOSIS — M25511 Pain in right shoulder: Secondary | ICD-10-CM | POA: Diagnosis not present

## 2023-02-07 LAB — LEAD, BLOOD (ADULT >= 16 YRS): Lead: <1 ug/dL (ref <3.5)

## 2023-02-07 NOTE — Progress Notes (Signed)
 Chief Complaint  Patient presents with   Annual Exam    HPI: Patient  Lynn Griffin  67 y.o. comes in today for Preventive Health Care visit  Lab screening done  NO major change in health but eating differently after [place].   Husband cooking now  maybe not as healthy .stays active  Feels hormonal issues ongoing  memory and such    Health Maintenance  Topic Date Due   Hepatitis C Screening  Never done   COVID-19 Vaccine (6 - 2024-25 season) 02/24/2023 (Originally 12/06/2022)   MAMMOGRAM  11/08/2023 (Originally 01/01/2023)   Colonoscopy  11/08/2023 (Originally 10/29/2015)   Medicare Annual Wellness (AWV)  10/04/2023   DTaP/Tdap/Td (3 - Td or Tdap) 09/03/2024   Pneumonia Vaccine 41+ Years old  Completed   INFLUENZA VACCINE  Completed   DEXA SCAN  Completed   Zoster Vaccines- Shingrix  Completed   HPV VACCINES  Aged Out   Health Maintenance Review LIFESTYLE:  Exercise:  walking biking weight s  knee meniscus  and shoulder  .  Lynn Griffin  Tobacco/ETS: n Alcohol:  3 per week Sugar beverages: no Sleep: about 7-8  Drug use: no HH of  2  nopets      ROS:  .   REST of 12 system review negative except as per HPI   Past Medical History:  Diagnosis Date   Breast cyst    Desensitization to allergy shot    hx of as a child    Exercise-induced asthma    Family history of ovarian cancer    Family history of uterine cancer    Melanoma (HCC)    Rectal bleeding 07/09/2010   Probably was beet juice     Treadmill stress test negative for angina pectoris    neg myoview 2008    Past Surgical History:  Procedure Laterality Date   childbirth x 2     hx allergy shots     left knee reconstruction  1989   MELANOMA EXCISION  04/19/2019   myoview stess     negative 2008    Family History  Problem Relation Age of Onset   Anemia Mother    Parkinsonism Mother 19   Coronary artery disease Father 7   Ovarian cancer Sister 36       d.  41   Uterine cancer Sister 72   Healthy Sister    Healthy Brother    Ovarian cancer Maternal Grandmother     Social History   Socioeconomic History   Marital status: Married    Spouse name: Not on file   Number of children: Not on file   Years of education: Not on file   Highest education level: Master's degree (e.g., MA, MS, MEng, MEd, MSW, MBA)  Occupational History   Not on file  Tobacco Use   Smoking status: Former   Smokeless tobacco: Never  Vaping Use   Vaping status: Never Used  Substance and Sexual Activity   Alcohol use: Yes    Comment: occasionally    Drug use: No   Sexual activity: Yes    Birth control/protection: Post-menopausal  Other Topics Concern   Not on file  Social History Narrative   Married   Former smoker   Regular Exercise- yes   hh of 2     1 cats   Child biirth x 2          Social Drivers of Health  Financial Resource Strain: Low Risk  (02/07/2023)   Overall Financial Resource Strain (CARDIA)    Difficulty of Paying Living Expenses: Not hard at all  Food Insecurity: No Food Insecurity (02/07/2023)   Hunger Vital Sign    Worried About Running Out of Food in the Last Year: Never true    Ran Out of Food in the Last Year: Never true  Transportation Needs: No Transportation Needs (02/07/2023)   PRAPARE - Administrator, Civil Service (Medical): No    Lack of Transportation (Non-Medical): No  Physical Activity: Sufficiently Active (02/07/2023)   Exercise Vital Sign    Days of Exercise per Week: 4 days    Minutes of Exercise per Session: 50 min  Stress: Stress Concern Present (02/07/2023)   Lynn Griffin of Occupational Health - Occupational Stress Questionnaire    Feeling of Stress : To some extent  Social Connections: Moderately Isolated (02/07/2023)   Social Connection and Isolation Panel [NHANES]    Frequency of Communication with Friends and Family: More than three times a week    Frequency of Social Gatherings with  Friends and Family: Three times a week    Attends Religious Services: Never    Active Member of Clubs or Organizations: No    Attends Banker Meetings: Patient declined    Marital Status: Married    Outpatient Medications Prior to Visit  Medication Sig Dispense Refill   Acetylcysteine (NAC PO) Take by mouth.     CRANBERRY PO Take by mouth daily. D- Mano     Magnesium Glycinate 120 MG CAPS in the morning and at bedtime.     Multiple Vitamins-Minerals (MULTIVITAMIN PO) Take 1 tablet by mouth daily.     Omega-3 Fatty Acids (OMEGA 3 PO) Take by mouth.     OVER THE COUNTER MEDICATION Transitions (herbal supplement for menopause)     Probiotic Product (RA PROBIOTIC COMPLEX) CAPS daily. At night     No facility-administered medications prior to visit.     EXAM:  BP 92/68 (BP Location: Left Arm, Patient Position: Sitting, Cuff Size: Normal)   Pulse 74   Temp 97.9 F (36.6 C) (Oral)   Ht 5' 6.4 (1.687 m)   Wt 132 lb (59.9 kg)   LMP 02/08/2010   SpO2 98%   BMI 21.05 kg/m   Body mass index is 21.05 kg/m. Wt Readings from Last 3 Encounters:  02/08/23 132 lb (59.9 kg)  10/04/22 125 lb (56.7 kg)  11/02/21 125 lb 8 oz (56.9 kg)    Physical Exam: Vital signs reviewed HZW:Uypd is a well-developed well-nourished alert cooperative    who appearsr stated age in no acute distress.  HEENT: normocephalic atraumatic , Eyes: PERRL EOM's full, conjunctiva clear, Nares: paten,t no deformity discharge or tenderness., Ears: no deformity EAC's 2+ wax TMs with normal landmarks. Mouth: clear OP, no lesions, edema.  Moist mucous membranes. Dentition in adequate repair. NECK: supple without masses, thyromegaly or bruits. CHEST/PULM:  Clear to auscultation and percussion breath sounds equal no wheeze , rales or rhonchi. No chest wall deformities or tenderness. Breast: normal by inspection . No dimpling, discharge, masses, tenderness or discharge . CV: PMI is nondisplaced, S1 S2 no  gallops, murmurs, rubs. Peripheral pulses are full without delay.No JVD .  ABDOMEN: Bowel sounds normal nontender  No guard or rebound, no hepato splenomegal no CVA tenderness.   Extremtities:  No clubbing cyanosis or edema, no acute joint swelling or redness no focal atrophy NEURO:  Oriented  x3, cranial nerves 3-12 appear to be intact, no obvious focal weakness,gait within normal limits no abnormal reflexes or asymmetrical SKIN: No acute rashes normal turgor, color, no bruising or petechiae. PSYCH: Oriented, good eye contact, no obvious depression anxiety, cognition and judgment appear normal. Not formally tested  LN: no cervical axillary adenopathy  Lab Results  Component Value Date   WBC 4.2 02/04/2023   HGB 13.5 02/04/2023   HCT 40.3 02/04/2023   PLT 271.0 02/04/2023   GLUCOSE 88 02/04/2023   CHOL 284 (H) 02/04/2023   TRIG 104.0 02/04/2023   HDL 80.00 02/04/2023   LDLDIRECT 185.9 08/15/2012   LDLCALC 183 (H) 02/04/2023   ALT 32 02/04/2023   AST 28 02/04/2023   NA 138 02/04/2023   K 4.1 02/04/2023   CL 105 02/04/2023   CREATININE 0.92 02/04/2023   BUN 17 02/04/2023   CO2 27 02/04/2023   TSH 4.48 02/04/2023   HGBA1C 5.7 12/24/2016    BP Readings from Last 3 Encounters:  02/08/23 92/68  11/02/21 100/68  09/29/21 102/60    Lab results reviewed with patient   ASSESSMENT AND PLAN:  Discussed the following assessment and plan:     ICD-10-CM   1. Visit for preventive health examination  Z00.00     2. HYPERLIPIDEMIA  E78.2 Lipid panel    Lipoprotein A (LPA)   ct calcium score 0 in 2022 father had early cv disease .    3. Influenza vaccine needed  Z23 Flu Vaccine Trivalent High Dose (Fluad)    4. Medication management  Z79.899     The 10-year ASCVD risk score (Arnett DK, et al., 2019) is: 4.2%   Values used to calculate the score:     Age: 60 years     Sex: Female     Is Non-Hispanic African American: No     Diabetic: No     Tobacco smoker: No     Systolic  Blood Pressure: 92 mmHg     Is BP treated: No     HDL Cholesterol: 80 mg/dL     Total Cholesterol: 284 mg/dL Suspect still low average risk   and will get back to  more attention diet ,   Plan 4-6 mos check on lipids  as per Shared Decision Making  Will add lipo a screen  Consider other for her concern of hormone post menopausal issues  Disc risk benefit of statins and what is known  would avoid supplements at this time because tested as food products and lack of datat  Return in about 6 months (around 08/08/2023) for 4-6 mos rasting lab appt  yearly check visit.  Patient Care Team: Charlett Apolinar POUR, MD as PCP - Diedre Robinson Pao, MD as Attending Physician (Dermatology) Nikki Cahill, MD (Obstetrics and Gynecology) Patient Instructions  Good to see you today  The 10-year ASCVD risk score (Arnett DK, et al., 2019) is: 4.2%   Values used to calculate the score:     Age: 51 years     Sex: Female     Is Non-Hispanic African American: No     Diabetic: No     Tobacco smoker: No     Systolic Blood Pressure: 92 mmHg     Is BP treated: No     HDL Cholesterol: 80 mg/dL     Total Cholesterol: 284 mg/dL Work on  healthier eating   Last ct calcium score was  2022. Zero. Make lab appt for updated fasting lipid panel for  4-6 months after intervention to follow    Derrion Tritz K. Chales Pelissier M.D.

## 2023-02-07 NOTE — Progress Notes (Signed)
Cholesterol  level higher  rest of labs normal or in range  will discuss at upcoming visit

## 2023-02-08 ENCOUNTER — Ambulatory Visit (INDEPENDENT_AMBULATORY_CARE_PROVIDER_SITE_OTHER): Payer: HMO | Admitting: Internal Medicine

## 2023-02-08 ENCOUNTER — Encounter: Payer: Self-pay | Admitting: Internal Medicine

## 2023-02-08 VITALS — BP 92/68 | HR 74 | Temp 97.9°F | Ht 66.4 in | Wt 132.0 lb

## 2023-02-08 DIAGNOSIS — E782 Mixed hyperlipidemia: Secondary | ICD-10-CM

## 2023-02-08 DIAGNOSIS — Z0000 Encounter for general adult medical examination without abnormal findings: Secondary | ICD-10-CM | POA: Diagnosis not present

## 2023-02-08 DIAGNOSIS — Z23 Encounter for immunization: Secondary | ICD-10-CM | POA: Diagnosis not present

## 2023-02-08 DIAGNOSIS — Z79899 Other long term (current) drug therapy: Secondary | ICD-10-CM | POA: Diagnosis not present

## 2023-02-08 NOTE — Patient Instructions (Signed)
 Good to see you today  The 10-year ASCVD risk score (Arnett DK, et al., 2019) is: 4.2%   Values used to calculate the score:     Age: 67 years     Sex: Female     Is Non-Hispanic African American: No     Diabetic: No     Tobacco smoker: No     Systolic Blood Pressure: 92 mmHg     Is BP treated: No     HDL Cholesterol: 80 mg/dL     Total Cholesterol: 284 mg/dL Work on  healthier eating   Last ct calcium score was  2022. Zero. Make lab appt for updated fasting lipid panel for 4-6 months after intervention to follow

## 2023-02-09 ENCOUNTER — Encounter: Payer: Self-pay | Admitting: Internal Medicine

## 2023-03-01 DIAGNOSIS — M25511 Pain in right shoulder: Secondary | ICD-10-CM | POA: Diagnosis not present

## 2023-03-04 ENCOUNTER — Telehealth: Payer: Self-pay

## 2023-03-08 ENCOUNTER — Telehealth: Payer: Self-pay

## 2023-03-08 NOTE — Telephone Encounter (Signed)
Attempted to reach pt to schedule an appt for pre-op clearance.   Left a voicemail to call us back.

## 2023-03-11 NOTE — Telephone Encounter (Signed)
Scheduled pt a surgical appt on 03/22/2023 at 3:30pm.  Forwarding to provider.

## 2023-03-11 NOTE — Telephone Encounter (Signed)
Spoke to pt. See other phone encounter.

## 2023-03-22 ENCOUNTER — Ambulatory Visit (INDEPENDENT_AMBULATORY_CARE_PROVIDER_SITE_OTHER): Payer: HMO | Admitting: Internal Medicine

## 2023-03-22 ENCOUNTER — Encounter: Payer: Self-pay | Admitting: Internal Medicine

## 2023-03-22 VITALS — BP 100/70 | HR 71 | Temp 98.0°F | Ht 66.4 in | Wt 131.6 lb

## 2023-03-22 DIAGNOSIS — M75101 Unspecified rotator cuff tear or rupture of right shoulder, not specified as traumatic: Secondary | ICD-10-CM

## 2023-03-22 DIAGNOSIS — M25511 Pain in right shoulder: Secondary | ICD-10-CM

## 2023-03-22 DIAGNOSIS — G8929 Other chronic pain: Secondary | ICD-10-CM | POA: Diagnosis not present

## 2023-03-22 DIAGNOSIS — S838X1S Sprain of other specified parts of right knee, sequela: Secondary | ICD-10-CM

## 2023-03-22 DIAGNOSIS — Z01818 Encounter for other preprocedural examination: Secondary | ICD-10-CM

## 2023-03-22 NOTE — Patient Instructions (Signed)
Optimized for   future ortho surgery  R reverse total shoulder arthroplasty at this time Will send in form   Good luck with the knee surgery first.

## 2023-03-22 NOTE — Progress Notes (Signed)
Chief Complaint  Patient presents with   Pre-op Exam    Pt is here for surgical clearance for arthroscopic on R knee.    HPI: Patient  Lynn Griffin  68 y.o. comes in today for pre operative assesment   Lynn Griffin  office Dr Everardo Pacific. For TSA reverse right  . However she is planning to have arthroscopic  r knee surgery for bucket handle cartilage tear sustained about a  year ago . Knee locks at times  .  Wants to do knee surgery before the shoulder replacement.  Form asks for   risk assessment for the shoulder surgery and  date TBD.  NO major change in health since her last assessment No CV  , neuro hematologic  bleeding risk   Exercise tolerance is very good .  Not limited otherwise .  Health Maintenance  Topic Date Due   Hepatitis C Screening  Never done   COVID-19 Vaccine (6 - 2024-25 season) 12/06/2022   MAMMOGRAM  11/08/2023 (Originally 01/01/2023)   Colonoscopy  11/08/2023 (Originally 10/29/2015)   Medicare Annual Wellness (AWV)  10/04/2023   DTaP/Tdap/Td (3 - Td or Tdap) 09/03/2024   Pneumonia Vaccine 29+ Years old  Completed   INFLUENZA VACCINE  Completed   DEXA SCAN  Completed   Zoster Vaccines- Shingrix  Completed   HPV VACCINES  Aged Out     ROS:  GEN/ HEENT: No fever, significant weight changes sweats headaches vision problems hearing changes, CV/ PULM; No chest pain shortness of breath cough, syncope,edema  change in exercise tolerance. GI /GU: No adominal pain, vomiting, change in bowel habits. No blood in the stool. No significant GU symptoms. SKIN/HEME: ,no acute skin rashes suspicious lesions or bleeding. No lymphadenopathy, nodules, masses.  NEURO/ PSYCH:  No neurologic signs such as weakness numbness. No depression anxiety. IMM/ Allergy: No unusual infections.  Allergy .   REST of 12 system review negative except as per HPI   Past Medical History:  Diagnosis Date   Breast cyst    Desensitization to allergy shot    hx of as a child     Exercise-induced asthma    Family history of ovarian cancer    Family history of uterine cancer    Melanoma (HCC)    Rectal bleeding 07/09/2010   Probably was beet juice     Treadmill stress test negative for angina pectoris    neg myoview 2008    Past Surgical History:  Procedure Laterality Date   childbirth x 2     hx allergy shots     left knee reconstruction  1989   MELANOMA EXCISION  04/19/2019   myoview stess     negative 2008    Family History  Problem Relation Age of Onset   Anemia Mother    Parkinsonism Mother 25   Coronary artery disease Father 31   Ovarian cancer Sister 50       d. 4   Uterine cancer Sister 54   Healthy Sister    Healthy Brother    Ovarian cancer Maternal Grandmother     Social History   Socioeconomic History   Marital status: Married    Spouse name: Not on file   Number of children: Not on file   Years of education: Not on file   Highest education level: Master's degree (e.g., MA, MS, MEng, MEd, MSW, MBA)  Occupational History   Not on file  Tobacco Use   Smoking status: Former  Smokeless tobacco: Never  Vaping Use   Vaping status: Never Used  Substance and Sexual Activity   Alcohol use: Yes    Comment: occasionally    Drug use: No   Sexual activity: Yes    Birth control/protection: Post-menopausal  Other Topics Concern   Not on file  Social History Narrative   Married   Former smoker   Regular Exercise- yes   hh of 2     1 cats   Child biirth x 2          Social Drivers of Corporate investment banker Strain: Low Risk  (02/07/2023)   Overall Financial Resource Strain (CARDIA)    Difficulty of Paying Living Expenses: Not hard at all  Food Insecurity: No Food Insecurity (02/07/2023)   Hunger Vital Sign    Worried About Running Out of Food in the Last Year: Never true    Ran Out of Food in the Last Year: Never true  Transportation Needs: No Transportation Needs (02/07/2023)   PRAPARE - Scientist, research (physical sciences) (Medical): No    Lack of Transportation (Non-Medical): No  Physical Activity: Sufficiently Active (02/07/2023)   Exercise Vital Sign    Days of Exercise per Week: 4 days    Minutes of Exercise per Session: 50 min  Stress: Stress Concern Present (02/07/2023)   [employer] of Occupational Health - Occupational Stress Questionnaire    Feeling of Stress : To some extent  Social Connections: Moderately Isolated (02/07/2023)   Social Connection and Isolation Panel [NHANES]    Frequency of Communication with Friends and Family: More than three times a week    Frequency of Social Gatherings with Friends and Family: Three times a week    Attends Religious Services: Never    Active Member of Clubs or Organizations: No    Attends Banker Meetings: Patient declined    Marital Status: Married    Outpatient Medications Prior to Visit  Medication Sig Dispense Refill   Acetylcysteine (NAC PO) Take by mouth.     ASHWAGANDHA PO Take by mouth. SeroLastin Brand     CRANBERRY PO Take by mouth daily. D- Mano     Magnesium Glycinate 120 MG CAPS in the morning and at bedtime.     Multiple Vitamins-Minerals (MULTIVITAMIN PO) Take 1 tablet by mouth daily.     Omega-3 Fatty Acids (OMEGA 3 PO) Take by mouth.     OVER THE COUNTER MEDICATION Transitions (herbal supplement for menopause)     Probiotic Product (RA PROBIOTIC COMPLEX) CAPS daily. At night     No facility-administered medications prior to visit.     EXAM:  BP 100/70 (BP Location: Left Arm, Patient Position: Sitting, Cuff Size: Normal)   Pulse 71   Temp 98 F (36.7 C) (Oral)   Ht 5' 6.4" (1.687 m)   Wt 131 lb 9.6 oz (59.7 kg)   LMP 02/08/2010   SpO2 97%   BMI 20.99 kg/m   Body mass index is 20.99 kg/m. Wt Readings from Last 3 Encounters:  03/22/23 131 lb 9.6 oz (59.7 kg)  02/08/23 132 lb (59.9 kg)  10/04/22 125 lb (56.7 kg)    Physical Exam: Vital signs reviewed HYQ:MVHQ is a well-developed  well-nourished alert cooperative    who appearsr stated age in no acute distress.  HEENT: normocephalic atraumatic , Eyes: PERRL EOM's full, conjunctiva clear, Nares: paten,t no deformity discharge or tenderness., Ears: no deformity EAC's clear TMs with normal  landmarks. Mouth: clear OP, no lesions, edema.  Moist mucous membranes. Dentition in adequate repair. NECK: supple without masses, thyromegaly or bruits. CHEST/PULM:  Clear to auscultation and percussion breath sounds equal no wheeze , rales or rhonchi. No chest wall deformities or tenderness.  CV: PMI is nondisplaced, S1 S2 no gallops, murmurs, rubs. Peripheral pulses are full without delay.No JVD .  ABDOMEN: Bowel sounds normal nontender  No guard or rebound, no hepato splenomegal no CVA tenderness.  Extremtities:  No clubbing cyanosis or edema, no acute joint swelling or redness no focal atrophy NEURO:  Oriented x3, cranial nerves 3-12 appear to be intact, no obvious focal weakness,gait within normal limits  SKIN: No acute rashes normal turgor, color, no bruising or petechiae. PSYCH: Oriented, good eye contact, no obvious depression anxiety, cognition and judgment appear normal. LN: no cervical adenopathy  Lab Results  Component Value Date   WBC 4.2 02/04/2023   HGB 13.5 02/04/2023   HCT 40.3 02/04/2023   PLT 271.0 02/04/2023   GLUCOSE 88 02/04/2023   CHOL 284 (H) 02/04/2023   TRIG 104.0 02/04/2023   HDL 80.00 02/04/2023   LDLDIRECT 185.9 08/15/2012   LDLCALC 183 (H) 02/04/2023   ALT 32 02/04/2023   AST 28 02/04/2023   NA 138 02/04/2023   K 4.1 02/04/2023   CL 105 02/04/2023   CREATININE 0.92 02/04/2023   BUN 17 02/04/2023   CO2 27 02/04/2023   TSH 4.48 02/04/2023   HGBA1C 5.7 12/24/2016    BP Readings from Last 3 Encounters:  03/22/23 100/70  02/08/23 92/68  11/02/21 100/68      ASSESSMENT AND PLAN:  Discussed the following assessment and plan:    ICD-10-CM   1. Pre-op evaluation  Z01.818     2. Current  tear of semilunar cartilage of right knee, sequela  S83.8X1S     3. Chronic right shoulder pain  M25.511    G89.29     4. Tear of right rotator cuff, unspecified tear extent, unspecified whether traumatic  M75.101     Optimized for surgery and low risk  age only risk factor .  Will complete form for  Dr Everardo Pacific  .  IF needs updated can  contact us fto update hx  if needed for  surgical team.  Return for as indicated, when planned.  Patient Care Team: Lynn Griffin, Lynn Mends, MD as PCP - Lynn Cao, MD as Attending Physician (Dermatology) Lynn Ewings, MD (Obstetrics and Gynecology) Patient Instructions  Optimized for   future ortho surgery  R reverse total shoulder arthroplasty at this time Will send in form   Good luck with the knee surgery first.  Lynn Griffin. Lynn Griffin M.D.

## 2023-03-23 ENCOUNTER — Telehealth: Payer: Self-pay

## 2023-03-23 NOTE — Telephone Encounter (Signed)
Surgical clearance and office notes were fax to [facility].   Received a confirmation.

## 2023-04-11 DIAGNOSIS — S83211A Bucket-handle tear of medial meniscus, current injury, right knee, initial encounter: Secondary | ICD-10-CM | POA: Diagnosis not present

## 2023-04-11 DIAGNOSIS — S83241A Other tear of medial meniscus, current injury, right knee, initial encounter: Secondary | ICD-10-CM | POA: Diagnosis not present

## 2023-04-11 DIAGNOSIS — Y999 Unspecified external cause status: Secondary | ICD-10-CM | POA: Diagnosis not present

## 2023-04-11 DIAGNOSIS — X58XXXA Exposure to other specified factors, initial encounter: Secondary | ICD-10-CM | POA: Diagnosis not present

## 2023-05-09 ENCOUNTER — Encounter: Payer: Self-pay | Admitting: Internal Medicine

## 2023-06-17 DIAGNOSIS — D224 Melanocytic nevi of scalp and neck: Secondary | ICD-10-CM | POA: Diagnosis not present

## 2023-06-17 DIAGNOSIS — S40861A Insect bite (nonvenomous) of right upper arm, initial encounter: Secondary | ICD-10-CM | POA: Diagnosis not present

## 2023-06-17 DIAGNOSIS — D171 Benign lipomatous neoplasm of skin and subcutaneous tissue of trunk: Secondary | ICD-10-CM | POA: Diagnosis not present

## 2023-06-17 DIAGNOSIS — D225 Melanocytic nevi of trunk: Secondary | ICD-10-CM | POA: Diagnosis not present

## 2023-06-17 DIAGNOSIS — L578 Other skin changes due to chronic exposure to nonionizing radiation: Secondary | ICD-10-CM | POA: Diagnosis not present

## 2023-06-17 DIAGNOSIS — Z86018 Personal history of other benign neoplasm: Secondary | ICD-10-CM | POA: Diagnosis not present

## 2023-06-17 DIAGNOSIS — Z808 Family history of malignant neoplasm of other organs or systems: Secondary | ICD-10-CM | POA: Diagnosis not present

## 2023-06-17 DIAGNOSIS — Z86006 Personal history of melanoma in-situ: Secondary | ICD-10-CM | POA: Diagnosis not present

## 2023-06-17 DIAGNOSIS — L821 Other seborrheic keratosis: Secondary | ICD-10-CM | POA: Diagnosis not present

## 2023-06-17 DIAGNOSIS — W57XXXA Bitten or stung by nonvenomous insect and other nonvenomous arthropods, initial encounter: Secondary | ICD-10-CM | POA: Diagnosis not present

## 2023-06-17 DIAGNOSIS — D2271 Melanocytic nevi of right lower limb, including hip: Secondary | ICD-10-CM | POA: Diagnosis not present

## 2023-06-25 ENCOUNTER — Encounter: Payer: Self-pay | Admitting: Internal Medicine

## 2023-06-30 ENCOUNTER — Ambulatory Visit (INDEPENDENT_AMBULATORY_CARE_PROVIDER_SITE_OTHER): Admitting: Family Medicine

## 2023-06-30 ENCOUNTER — Encounter: Payer: Self-pay | Admitting: Family Medicine

## 2023-06-30 VITALS — BP 102/70 | HR 66 | Temp 97.7°F | Wt 133.6 lb

## 2023-06-30 DIAGNOSIS — S30861A Insect bite (nonvenomous) of abdominal wall, initial encounter: Secondary | ICD-10-CM

## 2023-06-30 DIAGNOSIS — S40861A Insect bite (nonvenomous) of right upper arm, initial encounter: Secondary | ICD-10-CM | POA: Diagnosis not present

## 2023-06-30 DIAGNOSIS — W57XXXA Bitten or stung by nonvenomous insect and other nonvenomous arthropods, initial encounter: Secondary | ICD-10-CM

## 2023-06-30 MED ORDER — DOXYCYCLINE HYCLATE 100 MG PO TABS
100.0000 mg | ORAL_TABLET | Freq: Two times a day (BID) | ORAL | 0 refills | Status: DC
Start: 2023-06-30 — End: 2023-08-11

## 2023-06-30 MED ORDER — FLUCONAZOLE 150 MG PO TABS: 150.0000 mg | ORAL_TABLET | Freq: Every day | ORAL | 2 refills | Status: DC

## 2023-06-30 NOTE — Progress Notes (Signed)
   Subjective:    Patient ID: Lynn Griffin, female    DOB: 10-Sep-1955, 68 y.o.   MRN: [id number]  HPI Here for several tick bites in the past week. She spends a lot of time working in her yard. She pulled off 2 ticks in the last few days, and she has several itchy red spots on her body that are likely from tick bites. She has no other symptoms.    Review of Systems  Constitutional: Negative.   Respiratory: Negative.    Cardiovascular: Negative.   Skin:  Negative for rash.       Objective:   Physical Exam Constitutional:      Appearance: Normal appearance.  Cardiovascular:     Rate and Rhythm: Normal rate and regular rhythm.     Pulses: Normal pulses.     Heart sounds: Normal heart sounds.  Pulmonary:     Effort: Pulmonary effort is normal.     Breath sounds: Normal breath sounds.  Skin:    Comments: There are 4 small red macular bite marks, on right arm and the trunk   Neurological:     Mental Status: She is alert.           Assessment & Plan:  Recent tick bites, we will prophylax with 7 days of Doxycycline .  Corita Diego, MD

## 2023-08-11 ENCOUNTER — Ambulatory Visit (INDEPENDENT_AMBULATORY_CARE_PROVIDER_SITE_OTHER): Admitting: Internal Medicine

## 2023-08-11 VITALS — BP 110/78 | HR 70 | Temp 98.3°F | Wt 132.6 lb

## 2023-08-11 DIAGNOSIS — H109 Unspecified conjunctivitis: Secondary | ICD-10-CM

## 2023-08-11 MED ORDER — ERYTHROMYCIN 5 MG/GM OP OINT: 1.0000 | TOPICAL_OINTMENT | Freq: Two times a day (BID) | OPHTHALMIC | 0 refills | Status: AC

## 2023-08-11 NOTE — Progress Notes (Signed)
 Established Patient Office Visit     CC/Reason for Visit: Left eye redness and drainage  HPI: Lynn Griffin is a 68 y.o. female who is coming in today for the above mentioned reasons.  For the past 3 days has been having left eye redness and drainage.  She has what appears to be a stye in the center of her left lower eyelid.  She has been having what sounds like URI symptoms as well.   Past Medical/Surgical History: Past Medical History:  Diagnosis Date   Breast cyst    Desensitization to allergy shot    hx of as a child    Exercise-induced asthma    Family history of ovarian cancer    Family history of uterine cancer    Melanoma (HCC)    Rectal bleeding 07/09/2010   Probably was beet juice     Treadmill stress test negative for angina pectoris    neg myoview 2008    Past Surgical History:  Procedure Laterality Date   childbirth x 2     hx allergy shots     left knee reconstruction  1989   MELANOMA EXCISION  04/19/2019   myoview stess     negative 2008    Social History:  reports that she has quit smoking. She has never used smokeless tobacco. She reports current alcohol use. She reports that she does not use drugs.  Allergies: No Known Allergies  Family History:  Family History  Problem Relation Age of Onset   Anemia Mother    Parkinsonism Mother 53   Coronary artery disease Father 65   Ovarian cancer Sister 81       d. 79   Uterine cancer Sister 11   Healthy Sister    Healthy Brother    Ovarian cancer Maternal Grandmother      Current Outpatient Medications:    Acetylcysteine (NAC PO), Take by mouth., Disp: , Rfl:    CRANBERRY PO, Take by mouth daily. D- Mano, Disp: , Rfl:    erythromycin  ophthalmic ointment, Place 1 Application into the left eye 2 (two) times daily., Disp: 3.5 g, Rfl: 0   Magnesium Glycinate 120 MG CAPS, in the morning and at bedtime., Disp: , Rfl:    Multiple Vitamins-Minerals (MULTIVITAMIN PO), Take 1 tablet by mouth daily.,  Disp: , Rfl:    Omega-3 Fatty Acids (OMEGA 3 PO), Take by mouth., Disp: , Rfl:    OVER THE COUNTER MEDICATION, Transitions (herbal supplement for menopause), Disp: , Rfl:    Probiotic Product (RA PROBIOTIC COMPLEX) CAPS, daily. At night, Disp: , Rfl:   Review of Systems:  Negative unless indicated in HPI.   Physical Exam: Vitals:   08/11/23 1020  BP: 110/78  Pulse: 70  Temp: 98.3 F (36.8 C)  TempSrc: Oral  SpO2: 97%  Weight: 132 lb 9.6 oz (60.1 kg)    Body mass index is 21.15 kg/m.   Physical Exam Eyes:      Comments: There appears to be a small stye in the center of her left lower eyelid with a small amount of clear drainage      Impression and Plan:  Conjunctivitis of left eye, unspecified conjunctivitis type -     Erythromycin ; Place 1 Application into the left eye 2 (two) times daily.  Dispense: 3.5 g; Refill: 0   - I think more than a conjunctivitis, this is eye irritation from her stye.  Nonetheless to be on the safe side  we will give her some erythromycin  ointment, advised natural tears and massage with warm compress.  If no improvement, follow-up with her ophthalmologist would be appropriate.  Time spent:21 minutes reviewing chart, interviewing and examining patient and formulating plan of care.     Tully Theophilus Andrews, MD [facility]

## 2023-08-16 DIAGNOSIS — H0015 Chalazion left lower eyelid: Secondary | ICD-10-CM | POA: Diagnosis not present

## 2023-08-23 DIAGNOSIS — H0015 Chalazion left lower eyelid: Secondary | ICD-10-CM | POA: Diagnosis not present

## 2023-08-29 DIAGNOSIS — H26492 Other secondary cataract, left eye: Secondary | ICD-10-CM | POA: Diagnosis not present

## 2023-08-29 DIAGNOSIS — H2511 Age-related nuclear cataract, right eye: Secondary | ICD-10-CM | POA: Diagnosis not present

## 2023-10-07 ENCOUNTER — Ambulatory Visit: Payer: HMO

## 2023-10-07 VITALS — Ht 66.4 in | Wt 132.0 lb

## 2023-10-07 DIAGNOSIS — Z0000 Encounter for general adult medical examination without abnormal findings: Secondary | ICD-10-CM | POA: Diagnosis not present

## 2023-10-07 NOTE — Patient Instructions (Addendum)
 Ms. Lynn Griffin , Thank you for taking time out of your busy schedule to complete your Annual Wellness Visit with me. I enjoyed our conversation and look forward to speaking with you again next year. I, as well as your care team,  appreciate your ongoing commitment to your health goals. Please review the following plan we discussed and let me know if I can assist you in the future. Your Game plan/ To Do List    Referrals: If you haven't heard from the office you've been referred to, please reach out to them at the phone provided.   Follow up Visits: We will see or speak with you next year for your Next Medicare AWV with our clinical staff 10/12/24 @ 1:40p  Have you seen your provider in the last 6 months (3 months if uncontrolled diabetes)?   Clinician Recommendations:  Aim for 30 minutes of exercise or brisk walking, 6-8 glasses of water, and 5 servings of fruits and vegetables each day.       This is a list of the screenings recommended for you:  Health Maintenance  Topic Date Due   Hepatitis C Screening  Never done   COVID-19 Vaccine (6 - 2024-25 season) 12/06/2022   Flu Shot  09/09/2023   Mammogram  11/08/2023*   Colon Cancer Screening  11/08/2023*   DTaP/Tdap/Td vaccine (3 - Td or Tdap) 09/03/2024   Medicare Annual Wellness Visit  10/06/2024   Pneumococcal Vaccine for age over 65  Completed   DEXA scan (bone density measurement)  Completed   Zoster (Shingles) Vaccine  Completed   HPV Vaccine  Aged Out   Meningitis B Vaccine  Aged Out  *Topic was postponed. The date shown is not the original due date.    Advanced directives: (Declined) Advance directive discussed with you today. Even though you declined this today, please call our office should you change your mind, and we can give you the proper paperwork for you to fill out. Advance Care Planning is important because it:  [x]  Makes sure you receive the medical care that is consistent with your values, goals, and preferences  [x]   It provides guidance to your family and loved ones and reduces their decisional burden about whether or not they are making the right decisions based on your wishes.  Follow the link provided in your after visit summary or read over the paperwork we have mailed to you to help you started getting your Advance Directives in place. If you need assistance in completing these, please reach out to us  so that we can help you!  See attachments for Preventive Care and Fall Prevention Tips.

## 2023-10-07 NOTE — Progress Notes (Signed)
 Subjective:   Lynn Griffin is a 68 y.o. who presents for a Medicare Wellness preventive visit.  As a reminder, Annual Wellness Visits don't include a physical exam, and some assessments may be limited, especially if this visit is performed virtually. We may recommend an in-person follow-up visit with your provider if needed.  Visit Complete: Virtual I connected with  [name] on 10/07/23 by a audio enabled telemedicine application and verified that I am speaking with the correct person using two identifiers.  Patient Location: Home  Provider Location: Home Office  I discussed the limitations of evaluation and management by telemedicine. The patient expressed understanding and agreed to proceed.  Vital Signs: Because this visit was a virtual/telehealth visit, some criteria may be missing or patient reported. Any vitals not documented were not able to be obtained and vitals that have been documented are patient reported.    Persons Participating in Visit: Patient.  AWV Questionnaire: No: Patient Medicare AWV questionnaire was not completed prior to this visit.  Cardiac Risk Factors include: advanced age (>34men, >16 women)     Objective:    Today's Vitals   10/07/23 1333  Weight: 132 lb (59.9 kg)  Height: 5' 6.4 (1.687 m)   Body mass index is 21.05 kg/m.     10/07/2023    1:40 PM 10/04/2022    1:48 PM 09/29/2021   10:45 AM  Advanced Directives  Does Patient Have a Medical Advance Directive? No Yes Yes  Type of Special educational needs teacher of [place];Living will Healthcare Power of [place];Living will  Does patient want to make changes to medical advance directive?  No - Patient declined   Copy of Healthcare Power of Attorney in Chart?  Yes - validated most recent copy scanned in chart (See row information) Yes - validated most recent copy scanned in chart (See row information)  Would patient like information on creating a medical advance directive? No -  Patient declined      Current Medications (verified) Outpatient Encounter Medications as of 10/07/2023  Medication Sig   Acetylcysteine (NAC PO) Take by mouth.   CRANBERRY PO Take by mouth daily. D- Mano   erythromycin  ophthalmic ointment Place 1 Application into the left eye 2 (two) times daily.   Magnesium Glycinate 120 MG CAPS in the morning and at bedtime.   Multiple Vitamins-Minerals (MULTIVITAMIN PO) Take 1 tablet by mouth daily.   Omega-3 Fatty Acids (OMEGA 3 PO) Take by mouth.   OVER THE COUNTER MEDICATION Transitions (herbal supplement for menopause)   Probiotic Product (RA PROBIOTIC COMPLEX) CAPS daily. At night   No facility-administered encounter medications on file as of 10/07/2023.    Allergies (verified) Patient has no known allergies.   History: Past Medical History:  Diagnosis Date   Breast cyst    Desensitization to allergy shot    hx of as a child    Exercise-induced asthma    Family history of ovarian cancer    Family history of uterine cancer    Melanoma (HCC)    Rectal bleeding 07/09/2010   Probably was beet juice     Treadmill stress test negative for angina pectoris    neg myoview 2008   Past Surgical History:  Procedure Laterality Date   childbirth x 2     hx allergy shots     left knee reconstruction  1989   MELANOMA EXCISION  04/19/2019   myoview stess     negative 2008   Family  History  Problem Relation Age of Onset   Anemia Mother    Parkinsonism Mother 45   Coronary artery disease Father 48   Ovarian cancer Sister 81       d. 8   Uterine cancer Sister 6   Healthy Sister    Healthy Brother    Ovarian cancer Maternal Grandmother    Social History   Socioeconomic History   Marital status: Married    Spouse name: Not on file   Number of children: Not on file   Years of education: Not on file   Highest education level: Master's degree (e.g., MA, MS, MEng, MEd, MSW, MBA)  Occupational History   Not on file  Tobacco Use    Smoking status: Former   Smokeless tobacco: Never  Vaping Use   Vaping status: Never Used  Substance and Sexual Activity   Alcohol use: Yes    Comment: occasionally    Drug use: No   Sexual activity: Yes    Birth control/protection: Post-menopausal  Other Topics Concern   Not on file  Social History Narrative   Married   Former smoker   Regular Exercise- yes   hh of 2     1 cats   Child biirth x 2          Social Drivers of Corporate investment banker Strain: Low Risk  (10/07/2023)   Overall Financial Resource Strain (CARDIA)    Difficulty of Paying Living Expenses: Not hard at all  Food Insecurity: No Food Insecurity (10/07/2023)   Hunger Vital Sign    Worried About Running Out of Food in the Last Year: Never true    Ran Out of Food in the Last Year: Never true  Transportation Needs: No Transportation Needs (10/07/2023)   PRAPARE - Administrator, Civil Service (Medical): No    Lack of Transportation (Non-Medical): No  Physical Activity: Sufficiently Active (10/07/2023)   Exercise Vital Sign    Days of Exercise per Week: 4 days    Minutes of Exercise per Session: 60 min  Stress: No Stress Concern Present (10/07/2023)   [employer] of Occupational Health - Occupational Stress Questionnaire    Feeling of Stress: Not at all  Social Connections: Moderately Isolated (10/07/2023)   Social Connection and Isolation Panel    Frequency of Communication with Friends and Family: More than three times a week    Frequency of Social Gatherings with Friends and Family: More than three times a week    Attends Religious Services: Never    Database administrator or Organizations: No    Attends Engineer, structural: Never    Marital Status: Married    Tobacco Counseling Counseling given: Not Answered    Clinical Intake:  Pre-visit preparation completed: Yes  Pain : No/denies pain     BMI - recorded: 21.05 Nutritional Status: BMI of 19-24   Normal Nutritional Risks: None Diabetes: No  Lab Results  Component Value Date   HGBA1C 5.7 12/24/2016     How often do you need to have someone help you when you read instructions, pamphlets, or other written materials from your doctor or pharmacy?: 1 - Never  Interpreter Needed?: No  Information entered by :: Rojelio Blush LPN   Activities of Daily Living     10/07/2023    1:39 PM  In your present state of health, do you have any difficulty performing the following activities:  Hearing? 0  Vision? 0  Difficulty concentrating or making decisions? 0  Walking or climbing stairs? 0  Dressing or bathing? 0  Doing errands, shopping? 0  Preparing Food and eating ? N  Using the Toilet? N  In the past six months, have you accidently leaked urine? N  Do you have problems with loss of bowel control? N  Managing your Medications? N  Managing your Finances? N  Housekeeping or managing your Housekeeping? N    Patient Care Team: Panosh, Wanda K, MD as PCP - Diedre Robinson Pao, MD as Attending Physician (Dermatology) Silva, Zera, MD (Obstetrics and Gynecology)  I have updated your Care Teams any recent Medical Services you may have received from other providers in the past year.     Assessment:   This is a routine wellness examination for Mariesa.  Hearing/Vision screen Hearing Screening - Comments:: Denies hearing difficulties   Vision Screening - Comments:: Wears rx glasses - up to date with routine eye exams with  [facility]   Goals Addressed               This Visit's Progress     Continue physical activity (pt-stated)        Remain active       Depression Screen     10/07/2023    1:37 PM 10/04/2022    1:44 PM 11/02/2021    2:56 PM 09/29/2021   10:46 AM 08/04/2021    1:47 PM 07/22/2020    3:50 PM 06/08/2019    9:35 AM  PHQ 2/9 Scores  PHQ - 2 Score 0 0 4 0 2 2 0  PHQ- 9 Score   8  4 8  0    Fall Risk     10/07/2023    1:39 PM 09/30/2022    9:15 AM  11/02/2021    2:56 PM 11/02/2021   10:24 AM 09/29/2021   10:45 AM  Fall Risk   Falls in the past year? 0 0 0 0 0  Number falls in past yr: 0 0 0  0  Injury with Fall? 0 0 0  0  Risk for fall due to : No Fall Risks  No Fall Risks  No Fall Risks  Follow up Falls evaluation completed  Falls evaluation completed   Falls evaluation completed;Education provided;Falls prevention discussed      Data saved with a previous flowsheet row definition    MEDICARE RISK AT HOME:  Medicare Risk at Home Any stairs in or around the home?: No If so, are there any without handrails?: No Home free of loose throw rugs in walkways, pet beds, electrical cords, etc?: Yes Adequate lighting in your home to reduce risk of falls?: Yes Life alert?: No Use of a cane, walker or w/c?: No Grab bars in the bathroom?: No Shower chair or bench in shower?: No Elevated toilet seat or a handicapped toilet?: No  TIMED UP AND GO:  Was the test performed?  No  Cognitive Function: 6CIT completed        10/07/2023    1:41 PM 10/04/2022    1:48 PM 09/29/2021   10:50 AM  6CIT Screen  What Year? 0 points 0 points 0 points  What month? 0 points 0 points 0 points  What time? 0 points 0 points 0 points  Count back from 20 0 points 0 points 0 points  Months in reverse 0 points 0 points 0 points  Repeat phrase 0 points 0 points 6 points  Total Score 0 points  0 points 6 points    Immunizations Immunization History  Administered Date(s) Administered   Fluad Quad(high Dose 65+) 12/08/2020   Fluad Trivalent(High Dose 65+) 02/08/2023   Influenza Whole 12/31/2008, 02/06/2010   Influenza,inj,Quad PF,6+ Mos 10/23/2013, 10/15/2015   Moderna Sars-Covid-2 Vaccination 03/24/2022, 10/11/2022   PFIZER(Purple Top)SARS-COV-2 Vaccination 05/21/2019, 06/18/2019, 02/19/2020   PNEUMOCOCCAL CONJUGATE-20 08/04/2021   Td 06/22/2005   Tdap 09/04/2014   Zoster Recombinant(Shingrix) 05/13/2022, 10/01/2022    Screening Tests Health  Maintenance  Topic Date Due   Hepatitis C Screening  Never done   COVID-19 Vaccine (6 - 2024-25 season) 12/06/2022   INFLUENZA VACCINE  09/09/2023   MAMMOGRAM  11/08/2023 (Originally 01/01/2023)   Colonoscopy  11/08/2023 (Originally 10/29/2015)   DTaP/Tdap/Td (3 - Td or Tdap) 09/03/2024   Medicare Annual Wellness (AWV)  10/06/2024   Pneumococcal Vaccine: 50+ Years  Completed   DEXA SCAN  Completed   Zoster Vaccines- Shingrix  Completed   HPV VACCINES  Aged Out   Meningococcal B Vaccine  Aged Out    Health Maintenance  Health Maintenance Due  Topic Date Due   Hepatitis C Screening  Never done   COVID-19 Vaccine (6 - 2024-25 season) 12/06/2022   INFLUENZA VACCINE  09/09/2023   Health Maintenance Items Addressed:   Additional Screening:  Vision Screening: Recommended annual ophthalmology exams for early detection of glaucoma and other disorders of the eye. Would you like a referral to an eye doctor? No    Dental Screening: Recommended annual dental exams for proper oral hygiene  Community Resource Referral / Chronic Care Management: CRR required this visit?  No   CCM required this visit?  No   Plan:    I have personally reviewed and noted the following in the patient's chart:   Medical and social history Use of alcohol, tobacco or illicit drugs  Current medications and supplements including opioid prescriptions. Patient is not currently taking opioid prescriptions. Functional ability and status Nutritional status Physical activity Advanced directives List of other physicians Hospitalizations, surgeries, and ER visits in previous 12 months Vitals Screenings to include cognitive, depression, and falls Referrals and appointments  In addition, I have reviewed and discussed with patient certain preventive protocols, quality metrics, and best practice recommendations. A written personalized care plan for preventive services as well as general preventive health  recommendations were provided to patient.   Rojelio LELON Blush, LPN   1/[id number]/7974   After Visit Summary: (MyChart) Due to this being a telephonic visit, the after visit summary with patients personalized plan was offered to patient via MyChart   Notes: Nothing significant to report at this time.

## 2023-10-26 ENCOUNTER — Encounter: Payer: Self-pay | Admitting: Internal Medicine

## 2023-10-26 ENCOUNTER — Ambulatory Visit (INDEPENDENT_AMBULATORY_CARE_PROVIDER_SITE_OTHER): Admitting: Internal Medicine

## 2023-10-26 VITALS — BP 102/64 | HR 77 | Temp 98.3°F | Wt 134.1 lb

## 2023-10-26 DIAGNOSIS — L03115 Cellulitis of right lower limb: Secondary | ICD-10-CM

## 2023-10-26 DIAGNOSIS — Z23 Encounter for immunization: Secondary | ICD-10-CM

## 2023-10-26 MED ORDER — DOXYCYCLINE HYCLATE 100 MG PO TABS: 100.0000 mg | ORAL_TABLET | Freq: Two times a day (BID) | ORAL | 0 refills | Status: AC

## 2023-10-26 NOTE — Progress Notes (Signed)
 Established Patient Office Visit     CC/Reason for Visit: Right lower leg redness  HPI: Lynn Griffin is a 68 y.o. female who is coming in today for the above mentioned reasons.  She was vacationing in [place]  and hiking about 10 days ago where she struck the anterior part of her right lower leg against a log.  She covered it with a bandage while there.  She has noticed some redness and some streaky erythema which prompted her visit today.  Requesting flu vaccine.   Past Medical/Surgical History: Past Medical History:  Diagnosis Date   Breast cyst    Desensitization to allergy shot    hx of as a child    Exercise-induced asthma    Family history of ovarian cancer    Family history of uterine cancer    Melanoma (HCC)    Rectal bleeding 07/09/2010   Probably was beet juice     Treadmill stress test negative for angina pectoris    neg myoview 2008    Past Surgical History:  Procedure Laterality Date   childbirth x 2     hx allergy shots     left knee reconstruction  1989   MELANOMA EXCISION  04/19/2019   myoview stess     negative 2008    Social History:  reports that she has quit smoking. She has never used smokeless tobacco. She reports current alcohol use. She reports that she does not use drugs.  Allergies: No Known Allergies  Family History:  Family History  Problem Relation Age of Onset   Anemia Mother    Parkinsonism Mother 53   Coronary artery disease Father 61   Ovarian cancer Sister 40       d. 23   Uterine cancer Sister 24   Healthy Sister    Healthy Brother    Ovarian cancer Maternal Grandmother      Current Outpatient Medications:    Acetylcysteine (NAC PO), Take by mouth., Disp: , Rfl:    CRANBERRY PO, Take by mouth daily. D- Mano, Disp: , Rfl:    doxycycline  (VIBRA -TABS) 100 MG tablet, Take 1 tablet (100 mg total) by mouth 2 (two) times daily for 7 days., Disp: 14 tablet, Rfl: 0   erythromycin  ophthalmic ointment, Place 1 Application  into the left eye 2 (two) times daily., Disp: 3.5 g, Rfl: 0   Magnesium Glycinate 120 MG CAPS, in the morning and at bedtime., Disp: , Rfl:    Multiple Vitamins-Minerals (MULTIVITAMIN PO), Take 1 tablet by mouth daily., Disp: , Rfl:    Omega-3 Fatty Acids (OMEGA 3 PO), Take by mouth., Disp: , Rfl:    OVER THE COUNTER MEDICATION, Transitions (herbal supplement for menopause), Disp: , Rfl:    Probiotic Product (RA PROBIOTIC COMPLEX) CAPS, daily. At night, Disp: , Rfl:   Review of Systems:  Negative unless indicated in HPI.   Physical Exam: Vitals:   10/26/23 1319  BP: 102/64  Pulse: 77  Temp: 98.3 F (36.8 C)  TempSrc: Oral  SpO2: 98%  Weight: 134 lb 1.6 oz (60.8 kg)    Body mass index is 21.38 kg/m.   Physical Exam Musculoskeletal:     Right lower leg: No swelling. Edema present.     Left lower leg: Normal.      Impression and Plan:  Cellulitis of right lower leg -     Doxycycline  Hyclate; Take 1 tablet (100 mg total) by mouth 2 (two) times daily for 7  days.  Dispense: 14 tablet; Refill: 0  Immunization due   - With streaky erythema from wound base I am concerned about some cellulitis.  Will give doxycycline  for 7 days. - Flu vaccine administered.  Time spent:31 minutes reviewing chart, interviewing and examining patient and formulating plan of care.     Tully Theophilus Andrews, MD [facility]

## 2023-10-26 NOTE — Addendum Note (Signed)
 Addended by: KATHRYNE MILLMAN B on: 10/26/2023 01:45 PM   Modules accepted: Orders

## 2023-12-15 DIAGNOSIS — L821 Other seborrheic keratosis: Secondary | ICD-10-CM | POA: Diagnosis not present

## 2023-12-15 DIAGNOSIS — Z86018 Personal history of other benign neoplasm: Secondary | ICD-10-CM | POA: Diagnosis not present

## 2023-12-15 DIAGNOSIS — D2271 Melanocytic nevi of right lower limb, including hip: Secondary | ICD-10-CM | POA: Diagnosis not present

## 2023-12-15 DIAGNOSIS — D225 Melanocytic nevi of trunk: Secondary | ICD-10-CM | POA: Diagnosis not present

## 2023-12-15 DIAGNOSIS — D224 Melanocytic nevi of scalp and neck: Secondary | ICD-10-CM | POA: Diagnosis not present

## 2023-12-15 DIAGNOSIS — Z86006 Personal history of melanoma in-situ: Secondary | ICD-10-CM | POA: Diagnosis not present

## 2023-12-15 DIAGNOSIS — L570 Actinic keratosis: Secondary | ICD-10-CM | POA: Diagnosis not present

## 2023-12-15 DIAGNOSIS — D171 Benign lipomatous neoplasm of skin and subcutaneous tissue of trunk: Secondary | ICD-10-CM | POA: Diagnosis not present

## 2023-12-15 DIAGNOSIS — L578 Other skin changes due to chronic exposure to nonionizing radiation: Secondary | ICD-10-CM | POA: Diagnosis not present

## 2023-12-15 DIAGNOSIS — Z808 Family history of malignant neoplasm of other organs or systems: Secondary | ICD-10-CM | POA: Diagnosis not present

## 2023-12-20 ENCOUNTER — Other Ambulatory Visit (HOSPITAL_BASED_OUTPATIENT_CLINIC_OR_DEPARTMENT_OTHER): Payer: Self-pay

## 2023-12-20 DIAGNOSIS — H2511 Age-related nuclear cataract, right eye: Secondary | ICD-10-CM | POA: Diagnosis not present

## 2023-12-20 DIAGNOSIS — H25811 Combined forms of age-related cataract, right eye: Secondary | ICD-10-CM | POA: Diagnosis not present

## 2023-12-20 DIAGNOSIS — Z961 Presence of intraocular lens: Secondary | ICD-10-CM | POA: Diagnosis not present

## 2023-12-20 MED ORDER — NEOMYCIN-POLYMYXIN-DEXAMETH 0.1 % OP SUSP
1.0000 [drp] | Freq: Three times a day (TID) | OPHTHALMIC | 0 refills | Status: AC
  Filled 2023-12-20: qty 5, 7d supply, fill #0

## 2024-01-09 ENCOUNTER — Encounter: Payer: Self-pay | Admitting: Internal Medicine

## 2024-01-09 ENCOUNTER — Other Ambulatory Visit: Payer: Self-pay | Admitting: Internal Medicine

## 2024-01-09 DIAGNOSIS — Z1231 Encounter for screening mammogram for malignant neoplasm of breast: Secondary | ICD-10-CM

## 2024-01-17 ENCOUNTER — Telehealth: Payer: Self-pay | Admitting: *Deleted

## 2024-01-17 ENCOUNTER — Encounter: Payer: Self-pay | Admitting: Internal Medicine

## 2024-01-17 DIAGNOSIS — E559 Vitamin D deficiency, unspecified: Secondary | ICD-10-CM

## 2024-01-17 DIAGNOSIS — E785 Hyperlipidemia, unspecified: Secondary | ICD-10-CM

## 2024-01-17 DIAGNOSIS — Z79899 Other long term (current) drug therapy: Secondary | ICD-10-CM

## 2024-01-17 NOTE — Telephone Encounter (Signed)
 Are you having any symptoms?   I advise  to get your labs  and then discuss  utility of the ct calcium score before ordering  we can do that at  your next viist  or make a virtual appt to  discuss

## 2024-01-17 NOTE — Telephone Encounter (Signed)
 Copied from CRM [id number]. Topic: Clinical - Request for Lab/Test Order >> Jan 17, 2024  9:07 AM Burnard DEL wrote: Reason for CRM: Patent would like to have physical labs orders placed so that she could have labs drawn before she comes in for her physical on 02/14/2023.Please call patient to schedule once orders are placed.

## 2024-02-07 ENCOUNTER — Ambulatory Visit
Admission: RE | Admit: 2024-02-07 | Discharge: 2024-02-07 | Disposition: A | Discharge status: Home / Self Care | Source: Ambulatory Visit | Attending: Internal Medicine | Admitting: Internal Medicine

## 2024-02-07 DIAGNOSIS — Z1231 Encounter for screening mammogram for malignant neoplasm of breast: Secondary | ICD-10-CM

## 2024-02-13 NOTE — Progress Notes (Signed)
 "  Chief Complaint  Patient presents with   Annual Exam    Pt reports she is not fasting and will come back for blood work. Pt brought hemoccult specimen. Unsure who has sent it or when it was sent. Unsure the expiration date.     HPI: Patient  Lynn Griffin  69 y.o. comes in today for Preventive Health Care visit   Dermatology  routine yearly  fu  Hx of covid  l Father died  mi age 74 ask about   other ct calcium scan   no sx  Post covid x ?3-4  and boosters   anosmia and mental cognitive fog remains    Health Maintenance  Topic Date Due   COVID-19 Vaccine (6 - 2025-26 season) 03/01/2024 (Originally 10/10/2023)   Colonoscopy  04/13/2024 (Originally 10/29/2015)   Hepatitis C Screening  02/13/2025 (Originally 09/16/1973)   DTaP/Tdap/Td (3 - Td or Tdap) 09/03/2024   Medicare Annual Wellness (AWV)  10/06/2024   Mammogram  02/06/2026   Pneumococcal Vaccine: 50+ Years  Completed   Influenza Vaccine  Completed   Bone Density Scan  Completed   Zoster Vaccines- Shingrix  Completed   Meningococcal B Vaccine  Aged Out   Health Maintenance Review LIFESTYLE:  Exercise:   lots of walking and weight  hx if shoulder  Tobacco/ETS: n Alcohol:  rare Sugar beverages:n Sleep:  ok  Drug use: no HH of  2   no pets   Hx allergies    ROS:  GEN/ HEENT: No fever, significant weight changes sweats headaches vision problems hearing changes, CV/ PULM; No chest pain shortness of breath cough, syncope,edema  change in exercise tolerance. GI /GU: No adominal pain, vomiting, change in bowel habits. No blood in the stool. No significant GU symptoms. SKIN/HEME: ,no acute skin rashes suspicious lesions or bleeding. No lymphadenopathy, nodules, masses.  NEURO/ PSYCH:  No neurologic signs such as weakness numbness.  IMM/ Allergy: No unusual infections.  Allergy .   REST of 12 system review negative except as per HPI   Past Medical History:  Diagnosis Date   Breast cyst    Desensitization to allergy  shot    hx of as a child    Exercise-induced asthma    Family history of ovarian cancer    Family history of uterine cancer    Melanoma (HCC)    Rectal bleeding 07/09/2010   Probably was beet juice     Treadmill stress test negative for angina pectoris    neg myoview 2008    Past Surgical History:  Procedure Laterality Date   childbirth x 2     hx allergy shots     left knee reconstruction  1989   MELANOMA EXCISION  04/19/2019   myoview stess     negative 2008    Family History  Problem Relation Age of Onset   Anemia Mother    Parkinsonism Mother 69   Coronary artery disease Father 81   Ovarian cancer Sister 79       d. 26   Uterine cancer Sister 26   Healthy Sister    Ovarian cancer Maternal Grandmother    Healthy Brother    Breast cancer Neg Hx     Social History   Socioeconomic History   Marital status: Married    Spouse name: Not on file   Number of children: Not on file   Years of education: Not on file   Highest education level: Master's degree (e.g.,  MA, MS, MEng, MEd, MSW, MBA)  Occupational History   Not on file  Tobacco Use   Smoking status: Former   Smokeless tobacco: Never  Vaping Use   Vaping status: Never Used  Substance and Sexual Activity   Alcohol use: Yes    Comment: occasionally    Drug use: No   Sexual activity: Yes    Birth control/protection: Post-menopausal  Other Topics Concern   Not on file  Social History Narrative   Married   Former smoker   Regular Exercise- yes   hh of 2     1 cats   Child biirth x 2          Social Drivers of Health   Tobacco Use: Low Risk (12/07/2023)   Received from [facility]   Patient History    Smoking Tobacco Use: Never    Smokeless Tobacco Use: Never    Passive Exposure: Never  Recent Concern: Tobacco Use - Medium Risk (10/26/2023)   Patient History    Smoking Tobacco Use: Former    Smokeless Tobacco Use: Never    Passive Exposure: Not on Actuary Strain: Low Risk  (02/10/2024)   Overall Financial Resource Strain (CARDIA)    Difficulty of Paying Living Expenses: Not hard at all  Food Insecurity: No Food Insecurity (02/10/2024)   Epic    Worried About Radiation Protection Practitioner of Food in the Last Year: Never true    Ran Out of Food in the Last Year: Never true  Transportation Needs: No Transportation Needs (02/10/2024)   Epic    Lack of Transportation (Medical): No    Lack of Transportation (Non-Medical): No  Physical Activity: Sufficiently Active (02/10/2024)   Exercise Vital Sign    Days of Exercise per Week: 3 days    Minutes of Exercise per Session: 60 min  Stress: Stress Concern Present (02/10/2024)   [employer] of Occupational Health - Occupational Stress Questionnaire    Feeling of Stress: To some extent  Social Connections: Moderately Isolated (02/10/2024)   Social Connection and Isolation Panel    Frequency of Communication with Friends and Family: Three times a week    Frequency of Social Gatherings with Friends and Family: Three times a week    Attends Religious Services: Never    Active Member of Clubs or Organizations: No    Attends Banker Meetings: Not on file    Marital Status: Married  Depression (PHQ2-9): Medium Risk (02/14/2024)   Depression (PHQ2-9)    PHQ-2 Score: 5  Alcohol Screen: Low Risk (02/10/2024)   Alcohol Screen    Last Alcohol Screening Score (AUDIT): 3  Housing: Low Risk (02/10/2024)   Epic    Unable to Pay for Housing in the Last Year: No    Number of Times Moved in the Last Year: 0    Homeless in the Last Year: No  Utilities: Not At Risk (10/07/2023)   Epic    Threatened with loss of utilities: No  Health Literacy: Adequate Health Literacy (10/07/2023)   B1300 Health Literacy    Frequency of need for help with medical instructions: Never    Outpatient Medications Prior to Visit  Medication Sig Dispense Refill   Acetylcysteine (NAC PO) Take by mouth.     Cholecalciferol (VITAMIN D -3 PO) Take by mouth daily.      CRANBERRY PO Take by mouth daily. D- Mano     Folic Acid-Vit B6-Vit B12 (FOLBEE) 2.5-25-1 MG TABS tablet Take 1 tablet  by mouth daily.     Magnesium Glycinate 120 MG CAPS in the morning and at bedtime.     Multiple Vitamins-Minerals (MULTIVITAMIN PO) Take 1 tablet by mouth daily.     Omega-3 Fatty Acids (OMEGA 3 PO) Take by mouth.     OVER THE COUNTER MEDICATION Transitions (herbal supplement for menopause)     Probiotic Product (RA PROBIOTIC COMPLEX) CAPS daily. At night     erythromycin  ophthalmic ointment Place 1 Application into the left eye 2 (two) times daily. (Patient not taking: Reported on 02/14/2024) 3.5 g 0   No facility-administered medications prior to visit.     EXAM:  BP 106/80 (BP Location: Left Arm, Patient Position: Sitting, Cuff Size: Normal)   Pulse 70   Temp 97.9 F (36.6 C) (Oral)   Ht 5' 6.3 (1.684 m)   Wt 132 lb (59.9 kg)   LMP 02/08/2010   SpO2 98%   BMI 21.11 kg/m   Body mass index is 21.11 kg/m. Wt Readings from Last 3 Encounters:  02/14/24 132 lb (59.9 kg)  10/26/23 134 lb 1.6 oz (60.8 kg)  10/07/23 132 lb (59.9 kg)    Physical Exam: Vital signs reviewed HZW:Uypd is a well-developed well-nourished alert cooperative    who appearsr stated age in no acute distress.  HEENT: normocephalic atraumatic , Eyes: PERRL EOM's full, conjunctiva clear, Nares: paten,t no deformity discharge or tenderness., Ears: no deformity EAC's clear TMs with normal landmarks. Mouth: clear OP, no lesions, edema.  Moist mucous membranes. Dentition in adequate repair. NECK: supple without masses, thyromegaly or bruits. CHEST/PULM:  Clear to auscultation and percussion breath sounds equal no wheeze , rales or rhonchi. No chest wall deformities or tenderness. Breast: normal by inspection . No dimpling, discharge, masses, tenderness or discharge . CV: PMI is nondisplaced, S1 S2 no gallops, murmurs, rubs. Peripheral pulses are full without delay.No JVD .  ABDOMEN: Bowel sounds  normal nontender  No guard or rebound, no hepato splenomegal no CVA tenderness.  Extremtities:  No clubbing cyanosis or edema, no acute joint swelling or redness no focal atrophy NEURO:  Oriented x3, cranial nerves 3-12 appear to be intact, no obvious focal weakness,gait within normal limits no abnormal reflexes or asymmetrical SKIN: No acute rashes normal turgor, color, no bruising or petechiae. PSYCH: Oriented, good eye contact, no obvious depression anxiety, cognition and judgment appear normal. LN: no cervical axillary adenopathy  Lab Results  Component Value Date   WBC 4.2 02/04/2023   HGB 13.5 02/04/2023   HCT 40.3 02/04/2023   PLT 271.0 02/04/2023   GLUCOSE 88 02/04/2023   CHOL 284 (H) 02/04/2023   TRIG 104.0 02/04/2023   HDL 80.00 02/04/2023   LDLDIRECT 185.9 08/15/2012   LDLCALC 183 (H) 02/04/2023   ALT 32 02/04/2023   AST 28 02/04/2023   NA 138 02/04/2023   K 4.1 02/04/2023   CL 105 02/04/2023   CREATININE 0.92 02/04/2023   BUN 17 02/04/2023   CO2 27 02/04/2023   TSH 4.48 02/04/2023   HGBA1C 5.7 12/24/2016    BP Readings from Last 3 Encounters:  02/14/24 106/80  10/26/23 102/64  08/11/23 110/78   Last vitamin D  Lab Results  Component Value Date   VD25OH 67.20 02/04/2023    Lab plan  reviewed with patient  Last pap neg hpv 2021 Utd on cologuard  neg   ASSESSMENT AND PLAN:  Discussed the following assessment and plan:    ICD-10-CM   1. Visit for preventive health examination  Z00.00     2. HYPERLIPIDEMIA  E78.2 CT CARDIAC SCORING (SELF PAY ONLY)    3. Medication management  Z79.899     4. Family history of ovarian cancer  Z80.41 Ambulatory referral to Obstetrics / Gynecology   first degree  relative and grand parent    5. Family history of premature coronary heart disease  Z82.49 CT CARDIAC SCORING (SELF PAY ONLY)   father 78    No [place]  but asks about gyne check  pap ( neg hx ) but does have a fam hx x2 of ovarian cancer . I suggest get back to  gyne for advice exam and if any other  surveillance  is indicated  I will refer  Asks for ct calcium score  update  will do  Uncertain if memory decline vs stable since   covid  Irreg sleep may be a factor  Fu if  persistent or progressive   Lab update planned  Return in about 1 year (around 02/13/2025) for depending on results.  Patient Care Team: Charlett Apolinar POUR, MD as PCP - Diedre Robinson Pao, MD as Attending Physician (Dermatology) Silva, Zera, MD (Obstetrics and Gynecology) Patient Instructions  You are up to day on cologuard  2024   Will order  the ct  calcium score  update since.  To help decide on consideration of  cholesterol  lowering meds.   Get  your fasting labs . And plan fu .    Track sleep and habits   .    Brittney Caraway K. Kaylub Detienne M.D.  "

## 2024-02-14 ENCOUNTER — Encounter: Payer: Self-pay | Admitting: Internal Medicine

## 2024-02-14 ENCOUNTER — Ambulatory Visit (INDEPENDENT_AMBULATORY_CARE_PROVIDER_SITE_OTHER): Admitting: Internal Medicine

## 2024-02-14 VITALS — BP 106/80 | HR 70 | Temp 97.9°F | Ht 66.3 in | Wt 132.0 lb

## 2024-02-14 DIAGNOSIS — Z8041 Family history of malignant neoplasm of ovary: Secondary | ICD-10-CM | POA: Diagnosis not present

## 2024-02-14 DIAGNOSIS — Z8249 Family history of ischemic heart disease and other diseases of the circulatory system: Secondary | ICD-10-CM

## 2024-02-14 DIAGNOSIS — E782 Mixed hyperlipidemia: Secondary | ICD-10-CM

## 2024-02-14 DIAGNOSIS — Z0000 Encounter for general adult medical examination without abnormal findings: Secondary | ICD-10-CM | POA: Diagnosis not present

## 2024-02-14 DIAGNOSIS — Z79899 Other long term (current) drug therapy: Secondary | ICD-10-CM

## 2024-02-14 NOTE — Patient Instructions (Signed)
 You are up to day on cologuard  2024   Will order  the ct  calcium score  update since.  To help decide on consideration of  cholesterol  lowering meds.   Get  your fasting labs . And plan fu .    Track sleep and habits   .

## 2024-02-16 ENCOUNTER — Other Ambulatory Visit (INDEPENDENT_AMBULATORY_CARE_PROVIDER_SITE_OTHER)

## 2024-02-16 DIAGNOSIS — E559 Vitamin D deficiency, unspecified: Secondary | ICD-10-CM

## 2024-02-16 DIAGNOSIS — Z79899 Other long term (current) drug therapy: Secondary | ICD-10-CM

## 2024-02-16 DIAGNOSIS — E785 Hyperlipidemia, unspecified: Secondary | ICD-10-CM

## 2024-02-16 LAB — HEPATIC FUNCTION PANEL
ALT: 18 U/L (ref 3–35)
AST: 23 U/L (ref 5–37)
Albumin: 4.2 g/dL (ref 3.5–5.2)
Alkaline Phosphatase: 63 U/L (ref 39–117)
Bilirubin, Direct: 0.1 mg/dL (ref 0.1–0.3)
Total Bilirubin: 0.4 mg/dL (ref 0.2–1.2)
Total Protein: 6.7 g/dL (ref 6.0–8.3)

## 2024-02-16 LAB — BASIC METABOLIC PANEL WITH GFR
BUN: 19 mg/dL (ref 6–23)
CO2: 28 meq/L (ref 19–32)
Calcium: 9.1 mg/dL (ref 8.4–10.5)
Chloride: 106 meq/L (ref 96–112)
Creatinine, Ser: 0.85 mg/dL (ref 0.40–1.20)
GFR: 69.91 mL/min
Glucose, Bld: 85 mg/dL (ref 70–99)
Potassium: 4.3 meq/L (ref 3.5–5.1)
Sodium: 138 meq/L (ref 135–145)

## 2024-02-16 LAB — CBC WITH DIFFERENTIAL/PLATELET
Basophils Absolute: 0 K/uL (ref 0.0–0.1)
Basophils Relative: 1 % (ref 0.0–3.0)
Eosinophils Absolute: 0.2 K/uL (ref 0.0–0.7)
Eosinophils Relative: 4.5 % (ref 0.0–5.0)
HCT: 38.7 % (ref 36.0–46.0)
Hemoglobin: 13.1 g/dL (ref 12.0–15.0)
Lymphocytes Relative: 35.3 % (ref 12.0–46.0)
Lymphs Abs: 1.3 K/uL (ref 0.7–4.0)
MCHC: 33.8 g/dL (ref 30.0–36.0)
MCV: 93.9 fl (ref 78.0–100.0)
Monocytes Absolute: 0.3 K/uL (ref 0.1–1.0)
Monocytes Relative: 7.4 % (ref 3.0–12.0)
Neutro Abs: 1.9 K/uL (ref 1.4–7.7)
Neutrophils Relative %: 51.8 % (ref 43.0–77.0)
Platelets: 287 K/uL (ref 150.0–400.0)
RBC: 4.12 Mil/uL (ref 3.87–5.11)
RDW: 13 % (ref 11.5–15.5)
WBC: 3.7 K/uL — ABNORMAL LOW (ref 4.0–10.5)

## 2024-02-16 LAB — LIPID PANEL
Cholesterol: 241 mg/dL — ABNORMAL HIGH (ref 28–200)
HDL: 74.7 mg/dL
LDL Cholesterol: 152 mg/dL — ABNORMAL HIGH (ref 10–99)
NonHDL: 166.31
Total CHOL/HDL Ratio: 3
Triglycerides: 74 mg/dL (ref 10.0–149.0)
VLDL: 14.8 mg/dL (ref 0.0–40.0)

## 2024-02-16 LAB — HEMOGLOBIN A1C: Hgb A1c MFr Bld: 5.7 % (ref 4.6–6.5)

## 2024-02-17 LAB — TSH: TSH: 2.98 u[IU]/mL (ref 0.35–5.50)

## 2024-02-20 ENCOUNTER — Ambulatory Visit: Payer: Self-pay | Admitting: Internal Medicine

## 2024-02-20 NOTE — Progress Notes (Signed)
 Lipid panel better but ldl is still  up.  Lipo a level is pending  Gfr   is normal   no diabetes  rest of labs  ok . Awaiting  ct calcium score     to give more advice  The 10-year ASCVD risk score (Arnett DK, et al., 2019) is: 5.9%   Values used to calculate the score:     Age: 69 years     Clinically relevant sex: Female     Is Non-Hispanic African American: No     Diabetic: No     Tobacco smoker: No     Systolic Blood Pressure: 106 mmHg     Is BP treated: No     HDL Cholesterol: 74.7 mg/dL     Total Cholesterol: 241 mg/dL

## 2024-02-21 ENCOUNTER — Encounter: Payer: Self-pay | Admitting: Internal Medicine

## 2024-02-21 LAB — LIPOPROTEIN A (LPA): Lipoprotein (a): 12 nmol/L

## 2024-02-21 NOTE — Progress Notes (Signed)
 Lipo A result is favorable

## 2024-02-22 MED ORDER — AZITHROMYCIN 500 MG PO TABS: 500.0000 mg | ORAL_TABLET | Freq: Every day | ORAL | 0 refills | Status: AC

## 2024-02-22 NOTE — Telephone Encounter (Signed)
 I suggest if needed azithromycin  500 1-3 days if needed . Sent to [address] [facility]

## 2024-03-12 ENCOUNTER — Encounter: Payer: Self-pay | Admitting: Gastroenterology

## 2024-10-12 ENCOUNTER — Ambulatory Visit

## 2025-02-14 ENCOUNTER — Encounter: Admitting: Internal Medicine
